# Patient Record
Sex: Female | Born: 1985 | Race: White | Hispanic: No | Marital: Married | Smoking: Current some day smoker
Health system: Southern US, Community
[De-identification: ages and names within clinical notes are randomized; demographics above are authoritative.]

## PROBLEM LIST (undated history)

## (undated) DIAGNOSIS — I10 Essential (primary) hypertension: Secondary | ICD-10-CM

## (undated) HISTORY — PX: MANDIBLE FRACTURE SURGERY: SHX706

## (undated) HISTORY — PX: FOREARM SURGERY: SHX651

## (undated) HISTORY — PX: ANKLE SURGERY: SHX546

## (undated) HISTORY — PX: TRACHEAL SURGERY: SHX1096

---
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
## 2011-01-22 ENCOUNTER — Encounter: Payer: Self-pay | Admitting: Orthopedic Surgery

## 2011-02-05 ENCOUNTER — Encounter: Payer: Self-pay | Admitting: Orthopedic Surgery

## 2011-02-05 ENCOUNTER — Ambulatory Visit (INDEPENDENT_AMBULATORY_CARE_PROVIDER_SITE_OTHER): Payer: BC Managed Care – PPO | Admitting: Orthopedic Surgery

## 2011-02-05 DIAGNOSIS — M79609 Pain in unspecified limb: Secondary | ICD-10-CM | POA: Insufficient documentation

## 2011-02-05 DIAGNOSIS — M659 Synovitis and tenosynovitis, unspecified: Secondary | ICD-10-CM

## 2011-02-11 ENCOUNTER — Ambulatory Visit: Payer: BC Managed Care – PPO | Attending: Orthopedic Surgery | Admitting: Physical Therapy

## 2011-02-11 DIAGNOSIS — IMO0001 Reserved for inherently not codable concepts without codable children: Secondary | ICD-10-CM | POA: Insufficient documentation

## 2011-02-11 DIAGNOSIS — M25579 Pain in unspecified ankle and joints of unspecified foot: Secondary | ICD-10-CM | POA: Insufficient documentation

## 2011-02-11 DIAGNOSIS — R5381 Other malaise: Secondary | ICD-10-CM | POA: Insufficient documentation

## 2011-02-11 NOTE — Assessment & Plan Note (Signed)
Summary: ankle pain bringing xr/MRI film & reports/bsbs   Visit Type:  NEW PATIENT  CC:  left ankle pain.  History of Present Illness: I saw Debbe Hard in the office today for an initial visit.  She is a 25 years old woman with the complaint of:  left ankle pain  c/o dull , 4/10, intermittent, ankle pain   Initially treated with nsaids, bracr ( air cast) ice and the swelling went down. But she still has pain medially and laterall ove rthe dorsum of the foot.   Medications: Prednisone dose pack 10 mg taking now, Zithromax 250 mg, multivitamins, Fishoil.  Xrays taken at [facility] on 12-11-10. ankle,  were negative   Preventive Screening-Counseling & Management  Alcohol-Tobacco     Alcohol drinks/day: 0     Smoking Status: never      Drug Use:  never.    Allergies (verified): No Known Drug Allergies  Past History:  Past Medical History: RIGHT HAND CHEST TUBE JAW SURGERY, PLATES BILATERAL TRACHEA SURGERY RIGHT ARM PLATE RIGHT ANKLE PLATE  Family History: FH of Cancer:  Family History of Diabetes Family History Coronary Heart Disease female < 15 Family History of Arthritis  Social History: Patient is married.  Ethnicity:  White Alcohol drinks/day:  0 Smoking Status:  never Drug Use:  never  Review of Systems Constitutional:  Denies weight loss, weight gain, fever, chills, and fatigue. Cardiovascular:  Denies chest pain, palpitations, fainting, and murmurs. Respiratory:  Complains of short of breath, wheezing, couch, tightness, and snoring; denies pain on inspiration and snoring . Gastrointestinal:  Complains of heartburn; denies nausea, vomiting, diarrhea, constipation, and blood in your stools. Genitourinary:  Complains of frequency and urgency; denies difficulty urinating, painful urination, flank pain, and bleeding in urine. Neurologic:  Complains of numbness and tingling; denies unsteady gait, dizziness, tremors, and  seizure. Musculoskeletal:  Complains of joint pain, swelling, stiffness, and muscle pain; denies instability, redness, and heat. Endocrine:  Denies excessive thirst, exessive urination, and heat or cold intolerance. Psychiatric:  Denies nervousness, depression, anxiety, and hallucinations. Skin:  Denies changes in the skin, poor healing, rash, itching, and redness. HEENT:  Denies blurred or double vision, eye pain, redness, and watering. Immunology:  Denies seasonal allergies, sinus problems, and allergic to bee stings. Hemoatologic:  Denies easy bleeding and brusing.  Physical Exam  Additional Exam:  GEN: well developed, well nourished, normal grooming and hygiene, no deformity and endomorphic   CDV: pulses are normal, no edema, no erythema. no tenderness  Lymph: normal lymph nodes   Skin: no rashes, skin lesions or open sores   NEURO: normal coordination, reflexes, sensation.   Psyche: awake, alert and oriented. 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 1 by mouth two times a day  Patient Instructions: 1)  PT 2)  POSTERIOR TIBIAL TENDON DISORDER  3)  CONTINUE NSAIDS , ICE AND START PT  4)  foot orthotics  5)  diclofenac resume  Prescriptions: DICLOFENAC SODIUM  75 MG TBEC (DICLOFENAC SODIUM) 1 by mouth two times a day  #60 x 2   Entered and Authorized by:   Fuller Canada MD   Signed by:   Fuller Canada MD on 02/05/2011   Method used:   Print then Give to Patient   RxID:   [id number]    Orders Added: 1)   New Patient Level III [86578]

## 2011-02-11 NOTE — Medication Information (Signed)
Summary: RX Folder orthotics  RX Folder orthotics   Imported By: Cammie Sickle 02/05/2011 19:25:57  _____________________________________________________________________  External Attachment:    Type:   Image     Comment:   External Document

## 2011-02-19 ENCOUNTER — Ambulatory Visit: Payer: BC Managed Care – PPO | Admitting: Physical Therapy

## 2011-02-20 NOTE — Miscellaneous (Signed)
Summary: Physical therapy order  Physical therapy order   Imported By: Cammie Sickle 02/14/2011 09:08:17  _____________________________________________________________________  External Attachment:    Type:   Image     Comment:   External Document

## 2011-02-20 NOTE — Letter (Signed)
Summary: History form  History form   Imported By: Jacklynn Ganong 02/11/2011 11:48:05  _____________________________________________________________________  External Attachment:    Type:   Image     Comment:   External Document

## 2011-02-21 ENCOUNTER — Ambulatory Visit: Payer: BC Managed Care – PPO | Attending: Orthopedic Surgery | Admitting: Physical Therapy

## 2011-02-21 DIAGNOSIS — R5381 Other malaise: Secondary | ICD-10-CM | POA: Insufficient documentation

## 2011-02-21 DIAGNOSIS — M25579 Pain in unspecified ankle and joints of unspecified foot: Secondary | ICD-10-CM | POA: Insufficient documentation

## 2011-02-21 DIAGNOSIS — IMO0001 Reserved for inherently not codable concepts without codable children: Secondary | ICD-10-CM | POA: Insufficient documentation

## 2011-02-24 ENCOUNTER — Ambulatory Visit: Payer: BC Managed Care – PPO | Attending: Orthopedic Surgery | Admitting: Physical Therapy

## 2011-02-24 DIAGNOSIS — R5381 Other malaise: Secondary | ICD-10-CM | POA: Insufficient documentation

## 2011-02-24 DIAGNOSIS — M25579 Pain in unspecified ankle and joints of unspecified foot: Secondary | ICD-10-CM | POA: Insufficient documentation

## 2011-02-24 DIAGNOSIS — IMO0001 Reserved for inherently not codable concepts without codable children: Secondary | ICD-10-CM | POA: Insufficient documentation

## 2011-02-25 ENCOUNTER — Ambulatory Visit: Payer: BC Managed Care – PPO | Admitting: Physical Therapy

## 2011-03-05 ENCOUNTER — Ambulatory Visit: Payer: BC Managed Care – PPO | Admitting: Physical Therapy

## 2011-03-07 ENCOUNTER — Ambulatory Visit: Payer: BC Managed Care – PPO | Admitting: Physical Therapy

## 2011-03-11 ENCOUNTER — Ambulatory Visit: Payer: BC Managed Care – PPO | Admitting: *Deleted

## 2011-03-14 ENCOUNTER — Ambulatory Visit: Payer: BC Managed Care – PPO | Admitting: Physical Therapy

## 2011-03-21 ENCOUNTER — Ambulatory Visit: Payer: BC Managed Care – PPO | Admitting: *Deleted

## 2011-04-16 ENCOUNTER — Ambulatory Visit (INDEPENDENT_AMBULATORY_CARE_PROVIDER_SITE_OTHER): Payer: BC Managed Care – PPO | Admitting: Orthopedic Surgery

## 2011-04-16 DIAGNOSIS — M659 Synovitis and tenosynovitis, unspecified: Secondary | ICD-10-CM

## 2011-04-16 NOTE — Patient Instructions (Signed)
Short medium cam walker

## 2011-04-16 NOTE — Progress Notes (Signed)
Followup for posterior tibial tendon disorder  Completed physical therapy.  SheAlready had one set of orthotics still has continued dorsal foot pain dorsal lateral foot pain and medial pain and soreness  Exam she is tender over the medial posterior tibial tendon dorsum of the foot nontender at the posterior lateral malleolus non-tender at the peroneal tendon insertion mildly tender in the tarsometatarsal joint laterally and dorsally.  Recommend Cam Walker for 6 weeks and reassess.

## 2011-05-27 ENCOUNTER — Encounter: Payer: Self-pay | Admitting: Orthopedic Surgery

## 2011-05-27 ENCOUNTER — Ambulatory Visit (INDEPENDENT_AMBULATORY_CARE_PROVIDER_SITE_OTHER): Payer: BC Managed Care – PPO | Admitting: Orthopedic Surgery

## 2011-05-27 DIAGNOSIS — M76829 Posterior tibial tendinitis, unspecified leg: Secondary | ICD-10-CM

## 2011-05-27 DIAGNOSIS — M79609 Pain in unspecified limb: Secondary | ICD-10-CM

## 2011-05-27 DIAGNOSIS — M659 Synovitis and tenosynovitis, unspecified: Secondary | ICD-10-CM

## 2011-05-27 DIAGNOSIS — M6789 Other specified disorders of synovium and tendon, multiple sites: Secondary | ICD-10-CM

## 2011-05-27 MED ORDER — ACETAMINOPHEN-CODEINE 300-30 MG PO TABS: 1.0000 | ORAL_TABLET | Freq: Four times a day (QID) | ORAL | Status: AC | PRN

## 2011-05-27 NOTE — Progress Notes (Signed)
   25 year old female presented to me with medial ankle pain was treated with anti-inflammatories, physical therapy and I Cam Walker presents back with no improvement.  Complains of medial ankle pain and now she is walking on the outer border of the foot.  Her initial history is as follows: Xrays taken at [facility] on 12-11-10. ankle, were negative  Preventive Screening-Counseling & Management  Alcohol-Tobacco  Alcohol drinks/day: 0  Smoking Status: never  Drug Use: never.  Allergies (verified):  No Known Drug Allergies  Past History:  Past Medical History:  RIGHT HAND  CHEST TUBE  JAW SURGERY, PLATES BILATERAL  TRACHEA SURGERY  RIGHT ARM PLATE  RIGHT ANKLE PLATE  Family History:  FH of Cancer:  Family History of Diabetes  Family History Coronary Heart Disease female < 30  Family History of Arthritis  Social History:  Patient is married.  Ethnicity: White  Alcohol drinks/day: 0  Smoking Status: never  Drug Use: never  Review of Systems  Constitutional: Denies weight loss, weight gain, fever, chills, and fatigue.  Cardiovascular: Denies chest pain, palpitations, fainting, and murmurs.  Respiratory: Complains of short of breath, wheezing, couch, tightness, and snoring; denies pain on inspiration and snoring .  Gastrointestinal: Complains of heartburn; denies nausea, vomiting, diarrhea, constipation, and blood in your stools.  Genitourinary: Complains of frequency and urgency; denies difficulty urinating, painful urination, flank pain, and bleeding in urine.  Neurologic: Complains of numbness and tingling; denies unsteady gait, dizziness, tremors, and seizure.  Musculoskeletal: Complains of joint pain, swelling, stiffness, and muscle pain; denies instability, redness, and heat.  Endocrine: Denies excessive thirst, exessive urination, and heat or cold intolerance.  Psychiatric: Denies nervousness, depression, anxiety, and hallucinations.  Skin: Denies changes in the  skin, poor healing, rash, itching, and redness.  HEENT: Denies blurred or double vision, eye pain, redness, and watering.  Immunology: Denies seasonal allergies, sinus problems, and allergic to bee stings.  Hemoatologic: Denies easy bleeding and brusing.  Physical examination reveals tenderness of the medial posterior tibial tendon as well as peroneal spasm and weakness of the posterior tibial tendon  X-rays were  Recommend the foot and ankle specialist for evaluation and treatment as the nonoperative measures have failed to yield a good result

## 2011-05-27 NOTE — Patient Instructions (Signed)
If boot makes your foot hurt worse take it off  Referral to Dr. Lestine Box

## 2011-05-29 ENCOUNTER — Telehealth: Payer: Self-pay | Admitting: Radiology

## 2011-05-29 NOTE — Telephone Encounter (Signed)
I faxed a referral for this patient to Dr. Lestine Box to be seen for PTTD of left ankle.

## 2011-06-03 ENCOUNTER — Ambulatory Visit: Payer: BC Managed Care – PPO | Admitting: Orthopedic Surgery

## 2011-08-08 ENCOUNTER — Ambulatory Visit (HOSPITAL_COMMUNITY)
Admission: RE | Admit: 2011-08-08 | Discharge: 2011-08-08 | Disposition: A | Discharge status: Home / Self Care | Payer: BC Managed Care – PPO | Source: Ambulatory Visit | Attending: Orthopedic Surgery | Admitting: Orthopedic Surgery

## 2011-08-08 DIAGNOSIS — M25579 Pain in unspecified ankle and joints of unspecified foot: Secondary | ICD-10-CM | POA: Insufficient documentation

## 2011-08-08 DIAGNOSIS — M6281 Muscle weakness (generalized): Secondary | ICD-10-CM | POA: Insufficient documentation

## 2011-08-08 DIAGNOSIS — M25676 Stiffness of unspecified foot, not elsewhere classified: Secondary | ICD-10-CM | POA: Insufficient documentation

## 2011-08-08 DIAGNOSIS — IMO0001 Reserved for inherently not codable concepts without codable children: Secondary | ICD-10-CM | POA: Insufficient documentation

## 2011-08-08 DIAGNOSIS — M25673 Stiffness of unspecified ankle, not elsewhere classified: Secondary | ICD-10-CM | POA: Insufficient documentation

## 2011-08-08 DIAGNOSIS — R262 Difficulty in walking, not elsewhere classified: Secondary | ICD-10-CM | POA: Insufficient documentation

## 2011-08-08 DIAGNOSIS — M76829 Posterior tibial tendinitis, unspecified leg: Secondary | ICD-10-CM | POA: Insufficient documentation

## 2011-08-08 NOTE — Progress Notes (Signed)
Physical Therapy Evaluation  Patient Details  Name: Brenda Yang MRN: [id number] Date of Birth: 1986-01-28  Today's Date: 08/08/2011 Time: 4098-1191 Time Calculation (min): 29 min Charges: 1 eval Visit#: 1 of 6 Re-eval: 09/07/11    Past Medical History: No past medical history on file. Past Surgical History: No past surgical history on file.  Subjective Symptoms/Limitations Symptoms: Pt reports she has had pain in her L ankle for about 1 year.  She has tried OTC orthotics, short CAM boot, 6 weeks of PT in [place] several months ago.  Pt met w/Dr. Lestine Yang yesterday (08/07/11) and placed her in a mid calf CAM boot.  Previously therapy included [place], Stretching, TENS/Ice, band strengthening.  She was completeing her HEP which she reports helped with the pain and not the stiffness. Next MD visit in 3 weeks.  C/co is pain and MD gave her pain medication and she has not taken any today.  PMH: Has had tendonitis in L foot after walking in [place] for 3 days.  She works 12 hour shifts Limitations: Walking;Standing How long can you stand comfortably?: 20 minutes-4 hours with shoes. How long can you walk comfortably?: 20 minutes - 4 hours with shoes Pain Assessment Currently in Pain?: Yes Pain Score:   7 Pain Location: Ankle Pain Orientation: Lateral Pain Type: Chronic pain Pain Onset: More than a month ago  Precautions/Restrictions     Prior Functioning  Prior Function Driving: Yes Able to Take Stairs Reciprically: Yes Vocation: Full time employment Leisure: Hobbies-yes (Comment) Comments: Enjoys the outdoors (plays badmitten, hiking with husband, drives a stick shift has difficulty driving it)  Cognition    Sensation/Coordination/Flexibility    Assessment LLE AROM (degrees) Left Ankle Dorsiflexion 0-20: 5  Left Ankle Plantar Flexion 0-45: 45  Left Ankle Inversion: 25  Left Ankle Eversion: 5  LLE PROM (degrees) Left Ankle Dorsiflexion 0-20: 10  Left Ankle Plantar Flexion  0-45: 60  Left Ankle Eversion : 25 LLE Strength Left Hip Flexion: 4/5 Left Hip ABduction: 3+/5 Left Hip ADduction: 3+/5 Left Knee Flexion: 5/5 Left Knee Extension: 5/5 Left Ankle Dorsiflexion: 5/5 Left Ankle Plantar Flexion: 2+/5 Left Ankle Inversion: 5/5 Left Ankle Eversion: 5/5  Mobility (including Balance) Ambulation/Gait Ambulation/Gait: Yes Gait Pattern: Decreased stance time - left;Step-through pattern;Decreased dorsiflexion - left;Decreased weight shift to left  Static Standing Balance Single Leg Stance - Left Leg: 10  (impaired ankle strategy) Tandem Stance - Left Leg: 5  (impaired ankle and hip strategy)Ambulates on outside of L foot during stance phase Impaired ankle strategy with L single leg stance Palpation: Decreased L calcaneal mobility w/increased pain with calcaneal eversion.  Pain and tenderness with palpation to posterior tibialis musculotendinous junction.  Exercise/Treatments Stretches LLE:  Gastroc x30 sec  Soleus x30 sec  Plantar x30 sec Educated on T-band exercises, she has been given in the past with previous therapy.  Supplied Blue T-band w/pictures for [employer].   Physical Therapy Assessment and Plan PT Assessment and Plan Clinical Impression Statement: Pt is a 25 y.o.femal referred to PT secondary to L ankle posterior tibial tendonitis.  After examination it was found that the patient has current body structure impairments of increased pain, decreased strength, impaired balance, decreased calcaneal mobility which are limiting her ability to participate in work and lesiure activities.  Pt will benefit from skilled outpatient physical therapy services in order to address the above impairments in order to maximize independence. Rehab Potential: Good PT Frequency: Min 2X/week PT Duration:  (3 weeks) PT Treatment/Interventions: Gait training;Stair  training;Functional mobility training;Therapeutic exercise;Other (comment);Balance training (Manual and modalities  for pain control) PT Plan: Add: TM walking/Elliptical, Towel scrunches, Inversion/Eversion, Marble pick up, and t-band, manual technique for calcenal mobility check for order of iontopheresis that was faxed on 08/08/11 to Dr. Lestine Yang    Goals PT Short Term Goals PT Short Term Goal 1: 1.Pt will be Independent in HEP in order to maximize therapeutic effect. PT Short Term Goal 2: 2.Pt will report pain less than or 2/10 for 50% of her day. PT Short Term Goal 3: 3.Pt will improve ankle ROM to [facility]. PT Short Term Goal 4: 4.Pt will present with improved calcaneal mobility without an increase in pain.  PT Long Term Goals PT Long Term Goal 1: 1.Pt will report pain less than or equal to 2/10 for while independently walking for 60 minutes in order to participate in community and leisure activities. PT Long Term Goal 2: 2.Pt will demonstrate tandem walking on uneven surface x100 ft in order to participate safely in outdoor activities..  Problem List Patient Active Problem List  Diagnoses  . TENOSYNOVITIS OF FOOT AND ANKLE  . FOOT PAIN, LEFT  . Posterior tibial tendinitis  . Pain in joint, ankle and foot    PT - End of Session Activity Tolerance: Patient tolerated treatment well   Brenda Yang 08/08/2011, 7:06 PM  Physician Documentation Your signature is required to indicate approval of the treatment plan as stated above.  Please sign and either send electronically or make a copy of this report for your files and return this physician signed original.   Please mark one 1.__approve of plan  2. ___approve of plan with the following conditions.   ______________________________                                                          _____________________ Physician Signature                                                                                                             Date

## 2011-08-15 ENCOUNTER — Ambulatory Visit (HOSPITAL_COMMUNITY)
Admission: RE | Admit: 2011-08-15 | Discharge: 2011-08-15 | Disposition: A | Discharge status: Home / Self Care | Payer: BC Managed Care – PPO | Source: Ambulatory Visit | Attending: Physical Therapy | Admitting: Physical Therapy

## 2011-08-15 NOTE — Progress Notes (Signed)
Physical Therapy Treatment Patient Details  Name: Brenda Yang MRN: [id number] Date of Birth: [birth year]  Today's Date: 08/15/2011 Time: 4098-1191 Time Calculation (min): 45 min Charges: 8' [place], 15' man, 20' TE Visit#: 2  of 8   Re-eval: 09/07/11 Assessment Next MD Visit: 08/25/11  Subjective: Symptoms/Limitations Symptoms: Pt states that she is not really hurting right now she is doing her stretches every night. My pain is worse on the outside of my foot, even though I know I have swelling on the inside of my ankle.  I think it is because I am walking on the outside of my foot so much.  Pain Assessment Currently in Pain?: No/denies   Exercise/Treatments Stretches Gastroc Stretch: 3 reps;30 seconds Soleus Stretch: 3 reps;30 seconds Aerobic Elliptical: L1x 5' Seated Other Seated Knee Exercises: Plantar Fascia St 3x30 sec  Ankle Exercises Ankle Dorsiflexion: Left;10 reps;Seated;Theraband Theraband Level (Ankle Dorsiflexion): Level 4 (Blue) Ankle Plantar Flexion: Left;10 reps;Seated;Theraband Theraband Level (Ankle Plantar Flexion): Level 4 (Blue) Ankle Eversion: Left;10 reps;Seated;Theraband Theraband Level (Ankle Eversion): Level 4 (Blue) Ankle Inversion: Left;10 reps;Seated;Theraband Theraband Level (Ankle Inversion): Level 4 (Blue)  Towel Crunch: 1 rep Towel Inversion/Eversion: Limitations Towel Inversion/Eversion Limitations: 2x10 Marble Pick Up 3x5  Modalities: Modalities Ultrasound: Ultrasound Location L peroneal insertion Ultrasound Parameters 3 Mhz, 0.8 w/cm2 x8 minutes Ultrasound Goals Pain Manual Therapy: Manual Therapy Joint mobilization Joint Mobilization Grade II-IV calceneal, talar, and metatarsal joint mobs to increased IN, EV, DF and metatarsal mobility Other Manual Therapy STM 5th metatarsal to peroneal area.    Physical Therapy Assessment and Plan PT Assessment and Plan Clinical Impression Statement: Pt tolerated all new ther-ex well.  She had  decreased pain to peroneal insertion after manual techniques and [place] today.   PT Plan: Add heel and toe raises    Goals    Problem List Patient Active Problem List  Diagnoses  . TENOSYNOVITIS OF FOOT AND ANKLE  . FOOT PAIN, LEFT  . Posterior tibial tendinitis  . Pain in joint, ankle and foot    PT - End of Session Activity Tolerance: Patient tolerated treatment well  Fredick Schlosser 08/15/2011, 10:30 AM

## 2011-08-20 ENCOUNTER — Ambulatory Visit (HOSPITAL_COMMUNITY)
Admission: RE | Admit: 2011-08-20 | Discharge: 2011-08-20 | Disposition: A | Discharge status: Home / Self Care | Payer: BC Managed Care – PPO | Source: Ambulatory Visit

## 2011-08-20 NOTE — Progress Notes (Signed)
Physical Therapy Treatment Patient Details  Name: Brenda Yang MRN: [id number] Date of Birth: 09-Jan-1986  Today's Date: 08/20/2011 Time: 1521-1600 Time Calculation (min): 39 min Visit#: 3  of 8   Re-eval: 09/07/11  Charge: therex 26 min [place] 8 min  Subjective: Symptoms/Limitations Symptoms: Pt. states PT is helping, intermittent pain.  My pain was worse following work yesterday and this morning.  Maybe a 1/10 pain scale. Pain Assessment Currently in Pain?: Yes Pain Score:   1 Pain Location: Ankle Pain Orientation: Left  Objective:   Exercise/Treatments Aerobic  Elliptical: L1x 5'  Standing Gastroc Stretch: 3 reps;30 seconds  Soleus Stretch: 3 reps;30 seconds  Knee Exercises: Plantar Fascia St 3x30 sec  Heel raises 10 reps Toe raises 10 reps Ankle Exercises  Towel Crunch: 1 rep  Towel Inversion/Eversion: Limitations  Towel Inversion/Eversion Limitations: 2x10  Marble Pick Up 3x7 marbles   Physical Therapy Assessment and Plan PT Assessment and Plan Clinical Impression Statement: Pt demonstrated all new therex well with difficulty and no c/o of increased pain.  She stated decreased pain following manual technique and [place] today. PT Plan: Begin BAPS L2 within tolerance next session for ankle ROM.    Goals    Problem List Patient Active Problem List  Diagnoses  . TENOSYNOVITIS OF FOOT AND ANKLE  . FOOT PAIN, LEFT  . Posterior tibial tendinitis  . Pain in joint, ankle and foot    General Behavior During Session: [facility] for tasks performed Cognition: [facility] for tasks performed  [name] 08/20/2011, 4:43 PM

## 2011-08-21 ENCOUNTER — Ambulatory Visit (HOSPITAL_COMMUNITY)
Admission: RE | Admit: 2011-08-21 | Discharge: 2011-08-21 | Disposition: A | Discharge status: Home / Self Care | Payer: BC Managed Care – PPO | Source: Ambulatory Visit | Attending: Family Medicine | Admitting: Family Medicine

## 2011-08-21 NOTE — Progress Notes (Signed)
Physical Therapy Progress Note  Patient Details  Name: Brenda Yang MRN: [id number] Date of Birth: August 07, 1986  Today's Date: 08/21/2011 Time: 5284-1324 Time Calculation (min): 45 min Charges: 1 ROM, 30' TE, 8' [place] Visit#: 4  of 8   Re-eval: 09/04/11    Past Medical History: No past medical history on file. Past Surgical History: No past surgical history on file.  Subjective Symptoms/Limitations Symptoms: Pt reports that she feels that everythin is really helping to decrease her overall pain in her foot.  She reports her average pain is a 5-6/10.  On days I don't work it doesn't hurt to bad.  She reports she is about 50% How long can you stand comfortably?: 59min-4 hours with decrease in intensity in pain.  Pain Assessment Currently in Pain?: Yes Pain Score:   1 Pain Location: Ankle Pain Orientation: Left  Assessment LLE AROM (degrees) Left Ankle Dorsiflexion 0-20: 7  Left Ankle Plantar Flexion 0-45: 45  Left Ankle Inversion: 35  Left Ankle Eversion: 12  LLE PROM (degrees) Left Ankle Dorsiflexion 0-20: 12  Left Ankle Plantar Flexion 0-45: 60 (Eversion 35) LLE Strength Left Ankle Plantar Flexion: 5/5  Exercise/Treatments  Elliptical x6' 2.0 Seated  Marble Pick Up 5x7 marbles Gastroc and Soleus Stretch 3x30 sec each Unilateral heel raise 10x Tandem gait on uneven surface x100'  Ankle Exercises   Towel Crunch: 5 reps Towel Inversion/Eversion: 5 reps BAPS: Sitting;Level 2;10 reps;Limitations BAPS Limitations: A/P, IN/EV, CW, CCW x10 each  Modalities Modalities: Ultrasound Ultrasound Ultrasound Location: L peroneal insertion  Ultrasound Parameters: 3 Mhz, 0.8 w/cm2 pulsed x8 minutes  #3  Physical Therapy Assessment and Plan PT Assessment and Plan Clinical Impression Statement: Brenda Yang was referred to PT secondary to L posterior tibial tendonitis.  Over the past 3 weeks she has made significant gains in decreased intensity of pain to her posterior tibial  tendon region, improved ROM and strength.  She continues to have impairments including pain to her peroneal brevis insertion secondary to improper gait mechanics, balance and walking independently without an increase in pain, and will continue to need greater improvement with ankle flexibility and ROM.  She will continue to benefit from skilled PT in order to address the above impairments in order to maximize function.  Rehab Potential: Good PT Frequency: Min 2X/week PT Duration: Other (comment) (2 weeks) PT Treatment/Interventions: Gait training;Therapeutic exercise;Balance training;Other (comment) (Manual and modalities for pain control.) PT Plan: Add single leg stance, heel walking, toe walking    Goals PT Short Term Goals PT Short Term Goal 1: 1.Pt will be Independent in HEP in order to maximize therapeutic effect. PT Short Term Goal 1 - Progress: Met PT Short Term Goal 2: 2.Pt will report pain less than or 2/10 for 50% of her day. PT Short Term Goal 2 - Progress: Not met PT Short Term Goal 3: 3.Pt will improve ankle ROM to [facility]. PT Short Term Goal 4: 4.Pt will present with improved calcaneal mobility without an increase in pain.  PT Short Term Goal 4 - Progress: Met PT Long Term Goals PT Long Term Goal 1: 1.Pt will report pain less than or equal to 2/10 for while independently walking for 60 minutes in order to participate in community and leisure activities. PT Long Term Goal 1 - Progress: Not met PT Long Term Goal 2: 2.Pt will demonstrate tandem walking on uneven surface x100 ft in order to participate safely in outdoor activities..  Problem List Patient Active Problem List  Diagnoses  .  TENOSYNOVITIS OF FOOT AND ANKLE  . FOOT PAIN, LEFT  . Posterior tibial tendinitis  . Pain in joint, ankle and foot    PT - End of Session Activity Tolerance: Patient tolerated treatment well   Brenda Yang 08/21/2011, 4:34 PM  Physician Documentation Your signature is required to indicate  approval of the treatment plan as stated above.  Please sign and either send electronically or make a copy of this report for your files and return this physician signed original.   Please mark one 1.__approve of plan  2. ___approve of plan with the following conditions.   ______________________________                                                          _____________________ Physician Signature                                                                                                             Date

## 2011-08-22 ENCOUNTER — Ambulatory Visit (HOSPITAL_COMMUNITY): Payer: BC Managed Care – PPO

## 2011-08-25 ENCOUNTER — Ambulatory Visit (HOSPITAL_COMMUNITY)
Admission: RE | Admit: 2011-08-25 | Discharge: 2011-08-25 | Disposition: A | Discharge status: Home / Self Care | Payer: BC Managed Care – PPO | Source: Ambulatory Visit | Attending: Orthopedic Surgery | Admitting: Orthopedic Surgery

## 2011-08-25 DIAGNOSIS — M25676 Stiffness of unspecified foot, not elsewhere classified: Secondary | ICD-10-CM | POA: Insufficient documentation

## 2011-08-25 DIAGNOSIS — R262 Difficulty in walking, not elsewhere classified: Secondary | ICD-10-CM | POA: Insufficient documentation

## 2011-08-25 DIAGNOSIS — M6281 Muscle weakness (generalized): Secondary | ICD-10-CM | POA: Insufficient documentation

## 2011-08-25 DIAGNOSIS — M25579 Pain in unspecified ankle and joints of unspecified foot: Secondary | ICD-10-CM | POA: Insufficient documentation

## 2011-08-25 DIAGNOSIS — M25673 Stiffness of unspecified ankle, not elsewhere classified: Secondary | ICD-10-CM | POA: Insufficient documentation

## 2011-08-25 DIAGNOSIS — IMO0001 Reserved for inherently not codable concepts without codable children: Secondary | ICD-10-CM | POA: Insufficient documentation

## 2011-08-25 NOTE — Progress Notes (Signed)
Physical Therapy Treatment Patient Details  Name: Brenda Yang MRN: [id number] Date of Birth: [birth year]  Today's Date: 08/25/2011 Time: 4098-1191 Time Calculation (min): 40 min Visit#: 5  of 8   Re-eval: 09/04/11 Charges:  therex 25' , ultrasound 8'    Subjective: Symptoms/Limitations Symptoms: Pt. states she went to [facility] over the weekend and has increased pain today from the walking; 2-3/10 Pain Assessment Currently in Pain?: Yes Pain Score:   2 Pain Location: Ankle Pain Orientation: Left   Exercise/Treatments Elliptical 6' @ L2 Standing: Gastroc stretch with slant board 3X30" Heelwalk 1RT [place] 1RT L SLS max of 8 seconds Seated: BAPS L2 10X each direction Towel inversion/eversion 5X each with 3#   Modalities Modalities: Ultrasound Ultrasound Ultrasound Location: L peroneal insertion Ultrasound Parameters: , 0.8 w/cm2 pulsed 8 minutes #4  Physical Therapy Assessment and Plan PT Assessment and Plan Clinical Impression Statement: Pt. able to complete new exercises without difficulty or C/O pain.  Added 3# weight to towel inv/ev without difficulty. PT Treatment/Interventions: Therapeutic exercise (ultrasound) PT Plan: Progress BAPS to standing, Continue to increase stab and decrease pain.     Problem List Patient Active Problem List  Diagnoses  . TENOSYNOVITIS OF FOOT AND ANKLE  . FOOT PAIN, LEFT  . Posterior tibial tendinitis  . Pain in joint, ankle and foot    PT - End of Session Activity Tolerance: Patient tolerated treatment well General Behavior During Session: [facility] for tasks performed Cognition: [facility] for tasks performed  Bascom Levels, Laetitia Schnepf B 08/25/2011, 1:50 PM

## 2011-08-26 ENCOUNTER — Ambulatory Visit (HOSPITAL_COMMUNITY): Payer: BC Managed Care – PPO | Admitting: Physical Therapy

## 2011-08-27 ENCOUNTER — Ambulatory Visit (HOSPITAL_COMMUNITY)
Admission: RE | Admit: 2011-08-27 | Discharge: 2011-08-27 | Disposition: A | Discharge status: Home / Self Care | Payer: BC Managed Care – PPO | Source: Ambulatory Visit | Attending: Family Medicine | Admitting: Family Medicine

## 2011-08-27 NOTE — Progress Notes (Signed)
Physical Therapy Treatment Patient Details  Name: Brenda Yang MRN: [id number] Date of Birth: [birth year]  Today's Date: 08/27/2011 Time: 1191-4782 Time Calculation (min): 38 min Charges: 30' TE, 8' [place] Visit#: 6  of 8   Re-eval: 09/10/11    Subjective: Symptoms/Limitations Symptoms: Pt reports she had a visit with the MD who wants her to continue to wear her CAM boot until she can transition into her custom orthotics.  She reports she does not have any lingering pain from her trip.  Pain Assessment Currently in Pain?: No/denies  Exercise/Treatments  08/27/11 0700  Knee Exercises: Stretches  Gastroc Stretch 3 reps;30 seconds;Limitations  Gastroc Stretch Limitations on slant board  Knee Exercises: Aerobic  Elliptical L2 x6'  Knee Exercises: Standing  Lateral Step Up 10 reps;Step Height: 4"  Forward Step Up 10 reps;Step Height: 4"  Step Down 10 reps;Step Height: 4"     Ankle Exercises   Towel Crunch: Limitations Towel Crunch Limitations: 1 # Towel Inversion/Eversion: 5 reps Towel Inversion/Eversion Limitations: 3# Rocker Board: 1 minute;Limitations Rocker Board Limitations: A/P and S to S Heel Walk (Round Trip): 2 RT Toe Walk (Round Trip): 2 RT  Modalities Modalities: Ultrasound Ultrasound Ultrasound Location: L peroneal insertion Ultrasound Parameters: , 0.8 w/cm2 pulsed 8 minutes   Physical Therapy Assessment and Plan PT Assessment and Plan Clinical Impression Statement: Pt has an overall improvement in strength and ROM.  Continues to have an overall decrease in pain.  PT Frequency: Min 2X/week PT Duration:  (2 weeks)    Goals    Problem List Patient Active Problem List  Diagnoses  . TENOSYNOVITIS OF FOOT AND ANKLE  . FOOT PAIN, LEFT  . Posterior tibial tendinitis  . Pain in joint, ankle and foot    PT - End of Session Activity Tolerance: Patient tolerated treatment well  Donyale Berthold 08/27/2011, 4:07 PM

## 2011-09-01 ENCOUNTER — Ambulatory Visit (HOSPITAL_COMMUNITY)
Admission: RE | Admit: 2011-09-01 | Discharge: 2011-09-01 | Disposition: A | Discharge status: Home / Self Care | Payer: BC Managed Care – PPO | Source: Ambulatory Visit | Attending: Family Medicine | Admitting: Family Medicine

## 2011-09-01 NOTE — Progress Notes (Signed)
Physical Therapy Treatment Patient Details  Name: Brenda Yang MRN: [id number] Date of Birth: 01/25/1986  Today's Date: 09/01/2011 Time: 4098-1191 Time Calculation (min): 43 min Visit#: 7  of 8   Re-eval: 09/10/11  Charge: therex 29 min [place] 8 min  Subjective: Symptoms/Limitations Symptoms: Pain free at entrance  Objecitve Exercise/Treatments Elliptical 6' L3 Standing: Forward step up 10 reps 6" step Lateral step up 10 reps 4" step Step down 10 reps 4" step Gastroc st 3x 30" Soleus st 3x 30" BAPS standing A/P; R/L  5 reps each Rocker Board 1 minute Limitations: A/P and R/L  Heel Walk (Round Trip): 2 RT  Toe Walk (Round Trip): 2 RT  Towel Crunch: Limitations  Towel Crunch Limitations: 2 #  Towel Inversion/Eversion: 5 reps  Towel Inversion/Eversion Limitations: 3#   Modalities Modalities: Ultrasound Ultrasound Ultrasound Location: L peroneal insertion Ultrasound Parameters: , 0.8 w/cm2 pulsed 8 minutes  Physical Therapy Assessment and Plan PT Assessment and Plan Clinical Impression Statement: Overall improving strength and ROM.  Progressed BAPS board to standing with instability noted with most difficulty inversion/eversion movements. PT Plan: Reeval next session.    Goals    Problem List Patient Active Problem List  Diagnoses  . TENOSYNOVITIS OF FOOT AND ANKLE  . FOOT PAIN, LEFT  . Posterior tibial tendinitis  . Pain in joint, ankle and foot    PT - End of Session Activity Tolerance: Patient tolerated treatment well General Behavior During Session: [facility] for tasks performed Cognition: [facility] for tasks performed  [name] 09/01/2011, 4:11 PM

## 2011-09-03 ENCOUNTER — Ambulatory Visit (HOSPITAL_COMMUNITY): Payer: BC Managed Care – PPO | Admitting: Physical Therapy

## 2011-09-09 ENCOUNTER — Ambulatory Visit (HOSPITAL_COMMUNITY)
Admission: RE | Admit: 2011-09-09 | Discharge: 2011-09-09 | Disposition: A | Discharge status: Home / Self Care | Payer: BC Managed Care – PPO | Source: Ambulatory Visit

## 2011-09-09 NOTE — Progress Notes (Signed)
Physical Therapy Treatment Patient Details  Name: Brenda Yang MRN: [id number] Date of Birth: 02-Mar-1986  Today's Date: 09/09/2011 Time: 4098-1191 Time Calculation (min): 61 min Visit#: 8  of 8   Re-eval: 09/23/11  Charge: Therex 35 min [place] 8 min  Subjective: Symptoms/Limitations Symptoms: Little pain, 1-2/10, scheduled to get orthotics on this Friday. Pain Assessment Currently in Pain?: Yes Pain Score:   2 Pain Location: Ankle Pain Orientation: Left  Objective:   Exercise/Treatments Rocker Board: 2 minutes;Limitations Rocker Board Limitations: A/P; R/L x each SLS: max of 8 sec, 2x 30" with 1 finger assistance  Towel Crunch: 5 reps Towel Crunch Limitations: 3# Towel Inversion/Eversion: 5 reps Towel Inversion/Eversion Limitations: 4# BAPS: Standing;Level 2;10 reps BAPS Limitations: A/P, In/Ev, CW/ CCW 10 each SLS: max of 8 sec, 2x 30" with 1 finger assistance Rocker Board: 2 minutes;Limitations Rocker Board Limitations: A/P and R/L Heel Walk (Round Trip): 2 RT Toe Walk (Round Trip): 2 RT   Modalities Modalities: Ultrasound Ultrasound Ultrasound Location: L peroneal insertion Ultrasound Parameters: 3 MHz 0.8 w/cm2 pulsed x 8 minutes  Physical Therapy Assessment and Plan PT Assessment and Plan Clinical Impression Statement: Instabile ankle strategy shown with BAPS board standing.  Changed forward step up to a power up for endurance and overall ankle power/strength.  Pt assessed goals, after discussion with Annett Fabian, PT decision was made to continue PT for 2 more weeks once pt. is removed from boot and transitioned to her ortothic shoe this Friday.  PT Plan: Continue PT for 2 more weeks to address remaining deficitis.    Goals PT Short Term Goals PT Short Term Goal 1 - Progress: Met PT Short Term Goal 2 - Progress: Met PT Short Term Goal 4 - Progress: Met PT Long Term Goals PT Long Term Goal 1 - Progress: Progressing toward goal PT Long Term Goal 2 -  Progress: Progressing toward goal  Problem List Patient Active Problem List  Diagnoses  . TENOSYNOVITIS OF FOOT AND ANKLE  . FOOT PAIN, LEFT  . Posterior tibial tendinitis  . Pain in joint, ankle and foot    PT - End of Session Activity Tolerance: Patient tolerated treatment well General Behavior During Session: [facility] for tasks performed Cognition: [facility] for tasks performed  [name] 09/09/2011, 4:58 PM

## 2011-09-11 ENCOUNTER — Ambulatory Visit (HOSPITAL_COMMUNITY)
Admission: RE | Admit: 2011-09-11 | Discharge: 2011-09-11 | Disposition: A | Discharge status: Home / Self Care | Payer: BC Managed Care – PPO | Source: Ambulatory Visit | Attending: Family Medicine | Admitting: Family Medicine

## 2011-09-11 NOTE — Progress Notes (Signed)
Physical Therapy Treatment Patient Details  Name: Brenda Yang MRN: [id number] Date of Birth: [birth year]  Today's Date: 09/11/2011 Time: 4098-1191 Time Calculation (min): 47 min Charges: 39' TE, 8' NMR Visit#: 9  of 12   Re-eval: 09/23/11    Subjective: Symptoms/Limitations Symptoms: Pt reports that she is doing well, especially since she does not have to walk as much as she used to.  She reports she is getting her orthotic tomorrow.  Educated pt on proper proper orthotic use.  Pain Assessment Pain Score:   1 Pain Location: Ankle  Exercise/Treatments Ankle Exercises   Towel Crunch: Limitations Towel Crunch Limitations: 8 x  Towel Inversion/Eversion: Limitations Towel Inversion/Eversion Limitations: 8x, 4# BAPS: Standing;Level 2;10 reps BAPS Limitations: A/P, In/Ev, CW/ CCW 12x each SLS: 3x30 sec w/HHA on foam - cueing for proper foot alignment Rocker Board: 2 minutes;Limitations Rocker Board Limitations: A/P and R/L Power Ups on 4in. Stair x10  Gastroc and Soleus Stretch 3x30 sec  Physical Therapy Assessment and Plan PT Assessment and Plan Clinical Impression Statement: Pt continues to improve her overall strength and balance, however has difficulty with high level balance activities.  PT Plan: Cont to progress.     Goals    Problem List Patient Active Problem List  Diagnoses  . TENOSYNOVITIS OF FOOT AND ANKLE  . FOOT PAIN, LEFT  . Posterior tibial tendinitis  . Pain in joint, ankle and foot    PT - End of Session Activity Tolerance: Patient tolerated treatment well  Lehi Phifer 09/11/2011, 4:04 PM

## 2011-09-16 ENCOUNTER — Ambulatory Visit (HOSPITAL_COMMUNITY)
Admission: RE | Admit: 2011-09-16 | Discharge: 2011-09-16 | Disposition: A | Discharge status: Home / Self Care | Payer: BC Managed Care – PPO | Source: Ambulatory Visit | Attending: Family Medicine | Admitting: Family Medicine

## 2011-09-16 NOTE — Progress Notes (Signed)
Physical Therapy Treatment Patient Details  Name: Brenda Yang MRN: [id number] Date of Birth: 1986-01-14  Today's Date: 09/16/2011 Time: 0865-7846 Time Calculation (min): 48 min Charges: TE: 40', [place]: 8' Visit#: 10  of 12   Re-eval: 09/23/11    Subjective: Symptoms/Limitations Symptoms: Pt reports she recieved her orthotics on Friday and has been wearing them for about 4-5 hours.  She will bring them to therapy next week for her last week of therapy.  Pain Assessment Currently in Pain?: Yes Pain Score:   2 Pain Location: Ankle   Exercise/Treatments Aerobic Elliptical: 8' L4 Standing Wall Squat: 10 reps;10 seconds Lunge Walking - Round Trips: 2 RT Rocker Board: 2 minutes Rocker Board Limitations: A/P; R/L x each Other Standing Knee Exercises: heelwalk/ toewalk 2RT  Towel Crunch: 5 reps Towel Crunch Limitations: 4# Towel Inversion/Eversion: Limitations Towel Inversion/Eversion Limitations: 10x 8# BAPS: Standing;Level 3;10 reps BAPS Limitations: A/P, In/Ev, CW/ CCW 12x each Rocker Board: 2 minutes;Limitations Rocker Board Limitations: A/P and R/L Heel Walk (Round Trip): 2 RT Toe Walk (Round Trip): 2 RT  Modalities Modalities: Ultrasound Ultrasound Ultrasound Location: L peroneal insertion  Ultrasound Parameters: 8 MHz 0.8w/cm2 pulsed x8 minutes  Physical Therapy Assessment and Plan PT Assessment and Plan Clinical Impression Statement: Pt continues to show improved ankle strength, balance and ROM. PT Plan: Cont to progress, re-eval in 2 visits. Complete therapy with orthotics in.    Goals    Problem List Patient Active Problem List  Diagnoses  . TENOSYNOVITIS OF FOOT AND ANKLE  . FOOT PAIN, LEFT  . Posterior tibial tendinitis  . Pain in joint, ankle and foot       Suzie Vandam 09/16/2011, 3:19 PM

## 2011-09-17 ENCOUNTER — Ambulatory Visit (HOSPITAL_COMMUNITY): Payer: BC Managed Care – PPO | Admitting: Physical Therapy

## 2011-09-23 ENCOUNTER — Ambulatory Visit (HOSPITAL_COMMUNITY)
Admission: RE | Admit: 2011-09-23 | Discharge: 2011-09-23 | Disposition: A | Discharge status: Home / Self Care | Payer: BC Managed Care – PPO | Source: Ambulatory Visit | Attending: Family Medicine | Admitting: Family Medicine

## 2011-09-23 NOTE — Progress Notes (Addendum)
Physical Therapy Progress Note Patient Details  Name: Brenda Yang MRN: [id number] Date of Birth: 09/26/1986  Today's Date: 09/23/2011 Time: 2952-8413 Time Calculation (min): 39 min Charges: TE x 15', Manual x15', [place] x8' Visit#: 11  of 12   Re-eval: 09/23/11   Subjective Symptoms/Limitations Symptoms: Pt reports that she has been walking with her orthotics and they are helping her out a lot.  She is walking 1 mile a night and has mild pain to the medial surface of her foot.   Assessment LLE AROM (degrees) Left Ankle Dorsiflexion 0-20: 15  Left Ankle Plantar Flexion 0-45: 45  Left Ankle Inversion: 35  Left Ankle Eversion: 12   Exercise/Treatments Stretches Gastroc Stretch: Limitations Gastroc Stretch Limitations: 2 minutes Soleus Stretch: Limitations Soleus Stretch Limitations: 2 minutes Aerobic Elliptical: 8' L4 w/ortotics and shoes Standing SLS: 3x30 sec w/HHA; Balance Beam 5 RT  Modalities Modalities: Ultrasound Manual Therapy Manual Therapy: Joint mobilization Joint Mobilization: Grade III-IV mobs to 5th metatarsal w/STM to insertion of peroneus brevis. x8 minutes Ultrasound Ultrasound Location: L peroneal insertion  Ultrasound Parameters: 3 MHz (small head) 0.8w/cm2 pulsed x8 minutes   Physical Therapy Assessment and Plan PT Assessment and Plan Clinical Impression Statement: Brenda Yang has attended 11 outpatient PT visits and has made significant progress with L ankle and LLE strength, ROM, decreased pain, and improved gait.  She continues to have the greatest difficulty with balance and mild intermediate pain to the insertion of her perneous brevis which may be due to lack of strength at end range of eversion.  She has met 4/4 STG and 1/2 LTG.  She will benefit from 1 more outpatient PT visit to address remaining balance goal for a balance HEP and continue to benefit from an advanced HEP at home.  PT Plan: 1 visit to address balance.  Advanced HEP:  heel/toe walking, tandem gait, retro gait.  Manual and modalities to decrease overall pain. Then D/C - note already complete    Goals PT Short Term Goals PT Short Term Goal 1: 1.Pt will be Independent in HEP in order to maximize therapeutic effect. PT Short Term Goal 1 - Progress: Met PT Short Term Goal 2: 2.Pt will report pain less than or 2/10 for 50% of her day. PT Short Term Goal 2 - Progress: Met PT Short Term Goal 3: 3.Pt will improve ankle ROM to [facility]. PT Short Term Goal 3 - Progress: Met PT Short Term Goal 4: 4.Pt will present with improved calcaneal mobility without an increase in pain.  PT Short Term Goal 4 - Progress: Met PT Long Term Goals PT Long Term Goal 1: 1.Pt will report pain less than or equal to 2/10 for while independently walking for 60 minutes in order to participate in community and leisure activities. PT Long Term Goal 1 - Progress: Met PT Long Term Goal 2: 2.Pt will demonstrate tandem walking on uneven surface x100 ft in order to participate safely in outdoor activities.. PT Long Term Goal 2 - Progress: Not met  Problem List Patient Active Problem List  Diagnoses  . TENOSYNOVITIS OF FOOT AND ANKLE  . FOOT PAIN, LEFT  . Posterior tibial tendinitis  . Pain in joint, ankle and foot      Brenda Yang 09/23/2011, 3:50 PM  Physician Documentation Your signature is required to indicate approval of the treatment plan as stated above.  Please sign and either send electronically or make a copy of this report for your files and return this physician signed  original.   Please mark one 1.__approve of plan  2. ___approve of plan with the following conditions.   ______________________________                                                          _____________________ Physician Signature                                                                                                             Date

## 2011-09-25 ENCOUNTER — Ambulatory Visit (HOSPITAL_COMMUNITY)
Admission: RE | Admit: 2011-09-25 | Discharge: 2011-09-25 | Disposition: A | Discharge status: Home / Self Care | Payer: BC Managed Care – PPO | Source: Ambulatory Visit | Attending: Orthopedic Surgery | Admitting: Orthopedic Surgery

## 2011-09-25 DIAGNOSIS — M6281 Muscle weakness (generalized): Secondary | ICD-10-CM | POA: Insufficient documentation

## 2011-09-25 DIAGNOSIS — M25673 Stiffness of unspecified ankle, not elsewhere classified: Secondary | ICD-10-CM | POA: Insufficient documentation

## 2011-09-25 DIAGNOSIS — R262 Difficulty in walking, not elsewhere classified: Secondary | ICD-10-CM | POA: Insufficient documentation

## 2011-09-25 DIAGNOSIS — M25579 Pain in unspecified ankle and joints of unspecified foot: Secondary | ICD-10-CM | POA: Insufficient documentation

## 2011-09-25 DIAGNOSIS — M25676 Stiffness of unspecified foot, not elsewhere classified: Secondary | ICD-10-CM | POA: Insufficient documentation

## 2011-09-25 DIAGNOSIS — IMO0001 Reserved for inherently not codable concepts without codable children: Secondary | ICD-10-CM | POA: Insufficient documentation

## 2011-09-25 NOTE — Progress Notes (Signed)
Physical Therapy Treatment Patient Details  Name: Brenda Yang MRN: [id number] Date of Birth: 1986-11-12  Today's Date: 09/25/2011 Time: 2440-1027 Time Calculation (min): 40 min Visit#: 12  of 12   Re-eval:    Charge: therex: 8 min Neuro re-ed 8 min [place] 8 min Manual 8 min  Subjective: Symptoms/Limitations Symptoms: Pt reports [place] pain relief lasts for the rest of the day and the day following.  Pt stated she woke up this AM with slight pain on lateral surface of her L foot, pain scale 2/10. Pain Assessment Currently in Pain?: Yes Pain Score:   2 Pain Location: Ankle Pain Orientation: Left  Objective:  Exercise/Treatments Stretches Gastroc Stretch: 3 reps;30 seconds Soleus Stretch: 3 reps;30 seconds Aerobic Elliptical: 8' L5 w/ ortotic in shoes Standing SLS: 3x 30" sec with 1 finger HHA; L SLS 4" max of 4 Other Standing Knee Exercises: heelwalk/ toewalk 2RT Other Standing Knee Exercises: Tandem walking on Balance beam 3 RT, Retro walking 3 RT  Manual Therapy Joint Mobilization: Grade III-IV mobs to 5th metatarsal w/STM to insertion of peroneus brevis. x8 minutes  Ultrasound Ultrasound Location: L peroneal insertion Ultrasound Parameters: 3 MHz (small head) 0.8w/cm2 pulsed x8 minutes Ultrasound Goals: Pain  Physical Therapy Assessment and Plan PT Assessment and Plan Clinical Impression Statement: Pt presented with L ankle strength, ROM, improved gait and decreased pain following [place] and manual.  Pt able to complete total therex/neuro re-ed with most difficulty with SLS.  Balance still remains most difficulty. PT Plan: D/C to HEP per PT plan previous visit.     Goals    Problem List Patient Active Problem List  Diagnoses  . TENOSYNOVITIS OF FOOT AND ANKLE  . FOOT PAIN, LEFT  . Posterior tibial tendinitis  . Pain in joint, ankle and foot    PT - End of Session Activity Tolerance: Patient tolerated treatment well General Behavior During Session: [facility] for  tasks performed Cognition: [facility] for tasks performed  Brenda Yang 09/25/2011, 4:09 PM

## 2013-09-04 ENCOUNTER — Emergency Department (INDEPENDENT_AMBULATORY_CARE_PROVIDER_SITE_OTHER): Payer: BC Managed Care – PPO

## 2013-09-04 ENCOUNTER — Emergency Department (INDEPENDENT_AMBULATORY_CARE_PROVIDER_SITE_OTHER)
Admission: EM | Admit: 2013-09-04 | Discharge: 2013-09-04 | Disposition: A | Discharge status: Home / Self Care | Payer: BC Managed Care – PPO | Source: Home / Self Care | Attending: Emergency Medicine | Admitting: Emergency Medicine

## 2013-09-04 ENCOUNTER — Encounter (HOSPITAL_COMMUNITY): Payer: Self-pay | Admitting: Emergency Medicine

## 2013-09-04 DIAGNOSIS — S83106A Unspecified dislocation of unspecified knee, initial encounter: Secondary | ICD-10-CM

## 2013-09-04 DIAGNOSIS — S83104A Unspecified dislocation of right knee, initial encounter: Secondary | ICD-10-CM

## 2013-09-04 NOTE — ED Provider Notes (Signed)
Medical screening examination/treatment/procedure(s) were performed by non-physician practitioner and as supervising physician I was immediately available for consultation/collaboration.  Nels Munn, M.D.  Malonie Tatum C Idy Rawling, MD 09/04/13 1851 

## 2013-09-04 NOTE — ED Notes (Signed)
Pt reports she fell x 2 days ago and injured both knees. Fell on wooden stairs. Pt reports that the right knee is more painful and feels like she dislocated her kneecap. Pt is alert and in no acute distress.

## 2013-09-04 NOTE — ED Provider Notes (Signed)
CSN: [id number]     Arrival date & time 09/04/13  1538 History   None    Chief Complaint  Patient presents with  . Knee Pain   (Consider location/radiation/quality/duration/timing/severity/associated sxs/prior Treatment) HPI Comments: Pt fell onto R knee. Pt felt knee cap was displaced medially, then popped back into place.   Patient is a 27 y.o. female presenting with knee pain. The history is provided by the patient.  Knee Pain Location:  Knee Time since incident:  2 days Injury: yes   Mechanism of injury comment:  Fall onto knee Knee location:  R knee Pain details:    Quality:  Aching   Radiates to:  Does not radiate   Severity:  Moderate   Onset quality:  Sudden   Duration:  2 days   Timing:  Constant   Progression:  Partially resolved Chronicity:  New Dislocation: yes   Relieved by:  Nothing Worsened by:  Bearing weight Ineffective treatments:  None tried Associated symptoms: no numbness, no stiffness and no swelling     History reviewed. No pertinent past medical history. History reviewed. No pertinent past surgical history. History reviewed. No pertinent family history. History  Substance Use Topics  . Smoking status: Never Smoker   . Smokeless tobacco: Not on file  . Alcohol Use: Yes     Comment: occasionally   OB History   Grav Para Term Preterm Abortions TAB SAB Ect Mult Living                 Review of Systems  Musculoskeletal: Negative for joint swelling and stiffness.       Knee pain  Neurological: Negative for weakness and numbness.    Allergies  Review of patient's allergies indicates no known allergies.  Home Medications   Current Outpatient Rx  Name  Route  Sig  Dispense  Refill  . diclofenac (VOLTAREN) 75 MG EC tablet   Oral   Take 75 mg by mouth 2 (two) times daily.           . Multiple Vitamin (MULTIVITAMIN) tablet   Oral   Take 1 tablet by mouth daily.            BP 152/116  Pulse 82  Temp(Src) 98.9 F (37.2 C) (Oral)   Resp 17  SpO2 100%  LMP 08/10/2013 Physical Exam  Constitutional: She appears well-developed and well-nourished.  Musculoskeletal:       Right knee: She exhibits bony tenderness. She exhibits normal range of motion, no swelling, no deformity and no erythema. Tenderness found.  Skin: Skin is warm, dry and intact. Bruising noted.       ED Course  Procedures (including critical care time) Labs Review Labs Reviewed - No data to display Imaging Review Dg Knee Complete 4 Views Right  09/04/2013   CLINICAL DATA:  Right knee pain.  EXAM: RIGHT KNEE - COMPLETE 4+ VIEW  COMPARISON:  None.  FINDINGS: Imaged bones, joints and soft tissues appear normal.  IMPRESSION: Negative exam.   Electronically Signed   By: Drusilla Kanner M.D.   On: 09/04/2013 17:07    EKG Interpretation     Ventricular Rate:    PR Interval:    QRS Duration:   QT Interval:    QTC Calculation:   R Axis:     Text Interpretation:              MDM   1. Knee dislocation, right, initial encounter    bp recheck is  143/70 Knee is located at this time. Pt placed in knee immobilizer, and referred to ortho. She is pt of [facility].   Cathlyn Parsons, NP 09/04/13 1759

## 2013-09-06 ENCOUNTER — Encounter: Payer: Self-pay | Admitting: Orthopedic Surgery

## 2013-09-06 ENCOUNTER — Ambulatory Visit (INDEPENDENT_AMBULATORY_CARE_PROVIDER_SITE_OTHER): Payer: BC Managed Care – PPO | Admitting: Orthopedic Surgery

## 2013-09-06 VITALS — BP 146/86 | Ht 66.0 in | Wt 316.0 lb

## 2013-09-06 DIAGNOSIS — S83006A Unspecified dislocation of unspecified patella, initial encounter: Secondary | ICD-10-CM

## 2013-09-06 DIAGNOSIS — S83001A Unspecified subluxation of right patella, initial encounter: Secondary | ICD-10-CM

## 2013-09-06 DIAGNOSIS — S83003A Unspecified subluxation of unspecified patella, initial encounter: Secondary | ICD-10-CM | POA: Insufficient documentation

## 2013-09-06 MED ORDER — HYDROCODONE-ACETAMINOPHEN 5-325 MG PO TABS: 1.0000 | ORAL_TABLET | Freq: Three times a day (TID) | ORAL | Status: DC | PRN

## 2013-09-06 NOTE — Patient Instructions (Signed)
OOW THIS WEEK

## 2013-09-07 ENCOUNTER — Encounter: Payer: Self-pay | Admitting: Orthopedic Surgery

## 2013-09-07 NOTE — Progress Notes (Signed)
Patient ID: Brenda Yang, female   DOB: 02/23/1986, 27 y.o.   MRN: [id number]  Chief Complaint  Patient presents with  . Knee Pain    Right knee pain d/t injury 09/02/13    27 year old female first time patellar subluxation relocation on Friday, October 10 secondary to going up the stairs she fell. She complains of dull throbbing 4/10 pain she was placed in a knee immobilizer on Sunday in urgent care she complains of 4/10 dull throbbing pain with some numbness and tingling in the leg  She reports no positive findings on review of systems other than recorded in the history  No allergies  No medical problems no surgeries  No current medications  She is married she is an Geophysicist/field seismologist at [employer] she does not smoke she drinks socially she is noted to have a family history heart disease and arthritis cancer asthma and diabetes   Blood pressure 146/86, height 5\' 6"  (1.676 m), weight 316 lb (143.337 kg), last menstrual period 08/10/2013.  Vital signs: Blood pressure 146/86, height 5\' 6"  (1.676 m), weight 316 lb (143.337 kg), last menstrual period 08/10/2013.   General the patient is well-developed and well-nourished grooming and hygiene are normal Oriented x3 Mood and affect normal Ambulation normal  Inspection of the  right knee shows swelling loss of motion passive range of motion 30 patella feels stable at 20 muscle tone normal skin intact  Cardiovascular exam is normal Sensory exam normal  The x-ray that was taken shows a normal location of the knee and patella  Impression Encounter Diagnosis  Name Primary?  . Patellar subluxation, right, initial encounter Yes    Plan remove brace stat or couple more days to the end of the week go back to work Monday  Follow up in 4 weeks

## 2013-09-12 ENCOUNTER — Telehealth: Payer: Self-pay | Admitting: Orthopedic Surgery

## 2013-09-12 ENCOUNTER — Other Ambulatory Visit: Payer: Self-pay | Admitting: Orthopedic Surgery

## 2013-09-12 MED ORDER — CYCLOBENZAPRINE HCL 5 MG PO TABS: 5.0000 mg | ORAL_TABLET | Freq: Three times a day (TID) | ORAL | Status: DC | PRN

## 2013-09-12 NOTE — Telephone Encounter (Signed)
Medicine called in

## 2013-09-12 NOTE — Telephone Encounter (Signed)
Patient advised of doctor's reply °

## 2013-09-12 NOTE — Telephone Encounter (Signed)
Brenda Yang was seen last week for her right knee.  She says the swelling has gone down a lot but she is having bad cramps and muscle spasms From her right knee down to her right inner ankle.  Her next appointment is 10/06/13

## 2013-10-06 ENCOUNTER — Ambulatory Visit (INDEPENDENT_AMBULATORY_CARE_PROVIDER_SITE_OTHER): Payer: BC Managed Care – PPO | Admitting: Orthopedic Surgery

## 2013-10-06 VITALS — BP 147/77 | Ht 66.0 in | Wt 316.0 lb

## 2013-10-06 DIAGNOSIS — Z5189 Encounter for other specified aftercare: Secondary | ICD-10-CM

## 2013-10-06 DIAGNOSIS — S83004D Unspecified dislocation of right patella, subsequent encounter: Secondary | ICD-10-CM

## 2013-10-06 DIAGNOSIS — S83006A Unspecified dislocation of unspecified patella, initial encounter: Secondary | ICD-10-CM | POA: Insufficient documentation

## 2013-10-06 DIAGNOSIS — S83001A Unspecified subluxation of right patella, initial encounter: Secondary | ICD-10-CM

## 2013-10-06 MED ORDER — CYCLOBENZAPRINE HCL 5 MG PO TABS: 5.0000 mg | ORAL_TABLET | Freq: Three times a day (TID) | ORAL | Status: DC | PRN

## 2013-10-06 NOTE — Patient Instructions (Signed)
Do home exercises    

## 2013-10-06 NOTE — Progress Notes (Signed)
Patient ID: Brenda Yang, female   DOB: 23-Mar-1986, 27 y.o.   MRN: [id number]  Encounter Diagnoses  Name Primary?  . Patellar subluxation, right, initial encounter Yes  . Closed dislocation of patella, right, subsequent encounter     Chief Complaint  Patient presents with  . Follow-up    4 week recheck right knee patella subluxation DOI 09/02/13    BP 147/77  Ht 5\' 6"  (1.676 m)  Wt 316 lb (143.337 kg)  BMI 51.03 kg/m2  The patient is a 27 year old female first time patellar subluxation relocation of Friday, October 10 after falling down some stairs. She was treated nonoperatively with a brace. She comes in for reevaluation.  Complains of pain over the medial patella no subluxation dislocation episodes, complains of some tightness.  BP 147/77  Ht 5\' 6"  (1.676 m)  Wt 316 lb (143.337 kg)  BMI 51.03 kg/m2 General appearance is normal, the patient is alert and oriented x3 with normal mood and affect. She is tenderness over the medial retinaculum. No apprehension no instability full passive range of motion cruciate and carotid collateral ligaments are stable  Impression post patellar subluxation dislocation  Quadriceps exercises followup in a month

## 2013-10-10 ENCOUNTER — Telehealth: Payer: Self-pay | Admitting: Orthopedic Surgery

## 2013-10-10 NOTE — Telephone Encounter (Signed)
Patient called to inquire about having Dr. Romeo Apple order Voltaren gel, which she said she heard about and thought may help her knee.  If so, her pharmacy is [facility].  Her ph# is [phone].

## 2013-10-11 ENCOUNTER — Other Ambulatory Visit: Payer: Self-pay | Admitting: *Deleted

## 2013-10-11 DIAGNOSIS — S83001A Unspecified subluxation of right patella, initial encounter: Secondary | ICD-10-CM

## 2013-10-11 DIAGNOSIS — M25561 Pain in right knee: Secondary | ICD-10-CM

## 2013-10-11 MED ORDER — DICLOFENAC SODIUM 1 % TD GEL: 4.0000 g | Freq: Four times a day (QID) | TRANSDERMAL | Status: DC

## 2013-10-11 NOTE — Telephone Encounter (Signed)
Routing to Dr Harrison 

## 2013-10-11 NOTE — Telephone Encounter (Signed)
Try may not be approved

## 2013-10-11 NOTE — Telephone Encounter (Signed)
Prescription for Voltaren gel 4 g 4 times daily sent to pharmacy per Dr. Romeo Apple.

## 2013-11-08 ENCOUNTER — Ambulatory Visit: Payer: BC Managed Care – PPO | Admitting: Orthopedic Surgery

## 2014-01-18 ENCOUNTER — Encounter (HOSPITAL_COMMUNITY): Payer: Self-pay | Admitting: Emergency Medicine

## 2014-01-18 ENCOUNTER — Emergency Department (HOSPITAL_COMMUNITY)
Admission: EM | Admit: 2014-01-18 | Discharge: 2014-01-18 | Disposition: A | Discharge status: Home / Self Care | Payer: BC Managed Care – PPO | Source: Home / Self Care | Attending: Family Medicine | Admitting: Family Medicine

## 2014-01-18 DIAGNOSIS — S83001A Unspecified subluxation of right patella, initial encounter: Secondary | ICD-10-CM

## 2014-01-18 DIAGNOSIS — M545 Low back pain, unspecified: Secondary | ICD-10-CM

## 2014-01-18 DIAGNOSIS — S83006A Unspecified dislocation of unspecified patella, initial encounter: Secondary | ICD-10-CM

## 2014-01-18 MED ORDER — TRAMADOL HCL 50 MG PO TABS: 50.0000 mg | ORAL_TABLET | Freq: Four times a day (QID) | ORAL | Status: DC | PRN

## 2014-01-18 MED ORDER — PREDNISONE 10 MG PO KIT: PACK | ORAL | Status: DC

## 2014-01-18 MED ORDER — CYCLOBENZAPRINE HCL 5 MG PO TABS: 5.0000 mg | ORAL_TABLET | Freq: Every evening | ORAL | Status: DC | PRN

## 2014-01-18 NOTE — ED Notes (Signed)
C/o back pain States she was working in the yard yesterday when she feels as if she twisted her back yesterday Stated she did do a lot of heavy lifting also Hydrocodone, flexeril and heating pad was used as tx

## 2014-01-18 NOTE — ED Provider Notes (Signed)
Brenda Yang is a 28 y.o. female who presents to Urgent Care today for diffuse low back pain. Patient felt a pulling sensation in her low back while carrying to heavy buckets. This occurred yesterday. She notes the pain is mostly confined to her low back it does radiate to the bilateral thighs. She denies any pain radiating down her leg test her knees weakness numbness bowel bladder dysfunction or difficulty walking. She's tried leftover Norco and Flexeril which has not helped much. She denies any fevers or chills nausea vomiting or diarrhea. She feels well otherwise. She denies any significant injury or fall.   History reviewed. No pertinent past medical history. History  Substance Use Topics  . Smoking status: Never Smoker   . Smokeless tobacco: Not on file  . Alcohol Use: Yes     Comment: occasionally   ROS as above Medications: No current facility-administered medications for this encounter.   Current Outpatient Prescriptions  Medication Sig Dispense Refill  . cyclobenzaprine (FLEXERIL) 5 MG tablet Take 1 tablet (5 mg total) by mouth at bedtime as needed for muscle spasms.  20 tablet  0  . Multiple Vitamin (MULTIVITAMIN) tablet Take 1 tablet by mouth daily.        . PredniSONE 10 MG KIT 12 day dose pack po  1 kit  0  . traMADol (ULTRAM) 50 MG tablet Take 1 tablet (50 mg total) by mouth every 6 (six) hours as needed.  15 tablet  0    Exam:  BP 183/106  Pulse 91  Temp(Src) 98.1 F (36.7 C) (Oral)  Resp 18  SpO2 100%  LMP 01/16/2014 Gen: Well NAD obese HEENT: EOMI,  MMM Lungs: Normal work of breathing. CTABL Heart: RRR no MRG Abd: NABS, Soft. NT, ND Exts: Brisk capillary refill, warm and well perfused.  Back: Nontender to spinal midline. Tender palpation bilateral SI joint. Range of motion is normal to extension rotation but limited flexion due to pain Negative straight leg raise test. Negative Faber test and pretzel stretch bilaterally. Reflexes are equal bilateral  knees and ankles. Strength is intact bilateral lower extremities. Sensation is intact throughout.   Assessment and Plan: 28 y.o. female with lumbago due to myofascial disruption. Plan to treat with prednisone and tramadol as well as Flexeril and physical therapy. Avoid NSAIDs due to elevated blood pressure. Followup with primary care provider ASAP  Discussed warning signs or symptoms. Please see discharge instructions. Patient expresses understanding.    Gregor Hams, MD 01/18/14 1120

## 2014-01-18 NOTE — Discharge Instructions (Signed)
Thank you for coming in today. Take medications as directed Use a heating pad Physical therapy should contact you in a few days. If you have not heard from them please call the [address] branch of [facility]. Come back or go to the emergency room if you notice new weakness new numbness problems walking or bowel or bladder problems.  Back Exercises Back exercises help treat and prevent back injuries. The goal is to increase your strength in your belly (abdominal) and back muscles. These exercises can also help with flexibility. Start these exercises when told by your doctor. HOME CARE Back exercises include: Pelvic Tilt.  Lie on your back with your knees bent. Tilt your pelvis until the lower part of your back is against the floor. Hold this position 5 to 10 sec. Repeat this exercise 5 to 10 times. Knee to Chest.  Pull 1 knee up against your chest and hold for 20 to 30 seconds. Repeat this with the other knee. This may be done with the other leg straight or bent, whichever feels better. Then, pull both knees up against your chest. Sit-Ups or Curl-Ups.  Bend your knees 90 degrees. Start with tilting your pelvis, and do a partial, slow sit-up. Only lift your upper half 30 to 45 degrees off the floor. Take at least 2 to 3 seonds for each sit-up. Do not do sit-ups with your knees out straight. If partial sit-ups are difficult, simply do the above but with only tightening your belly (abdominal) muscles and holding it as told. Hip-Lift.  Lie on your back with your knees flexed 90 degrees. Push down with your feet and shoulders as you raise your hips 2 inches off the floor. Hold for 10 seconds, repeat 5 to 10 times. Back Arches.  Lie on your stomach. Prop yourself up on bent elbows. Slowly press on your hands, causing an arch in your low back. Repeat 3 to 5 times. Shoulder-Lifts.  Lie face down with arms beside your body. Keep hips and belly pressed to floor as you  slowly lift your head and shoulders off the floor. Do not overdo your exercises. Be careful in the beginning. Exercises may cause you some mild back discomfort. If the pain lasts for more than 15 minutes, stop the exercises until you see your doctor. Improvement with exercise for back problems is slow.  Document Released: 12/13/2010 Document Revised: 02/02/2012 Document Reviewed: 09/11/2011 [facility] Patient Information 2014 [place], [place].

## 2014-11-11 IMAGING — CR DG KNEE COMPLETE 4+V*R*
4 series · 4 of 4 positions shown · non-contrast
Comparison: None.

CLINICAL DATA: Right knee pain.

EXAM:
RIGHT KNEE - COMPLETE 4+ VIEW

[view not recorded (1 of 4)]
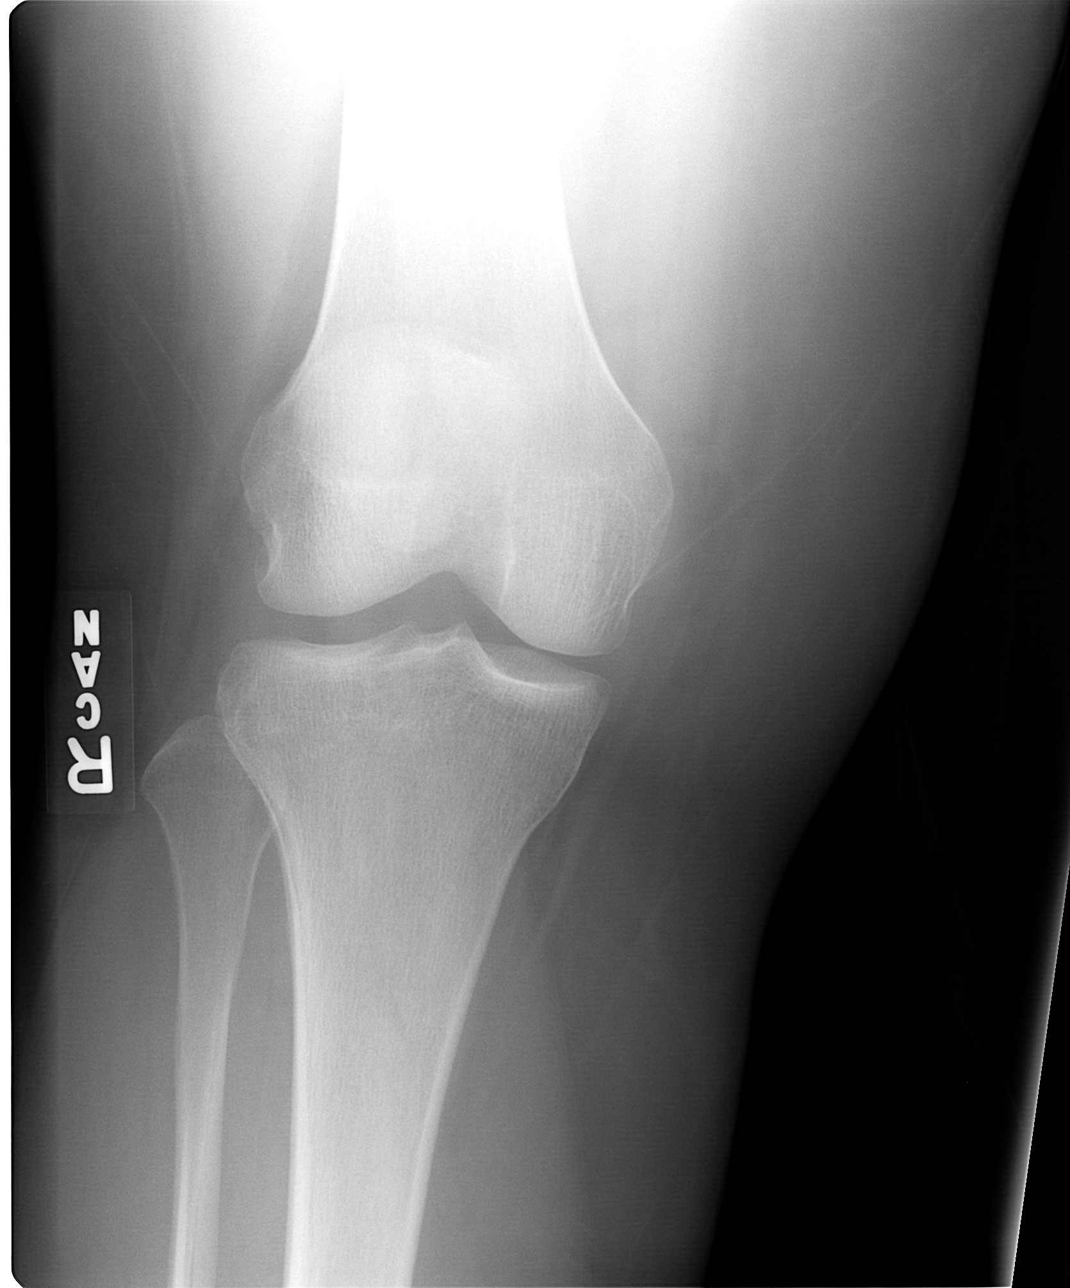

[view not recorded (2 of 4)]
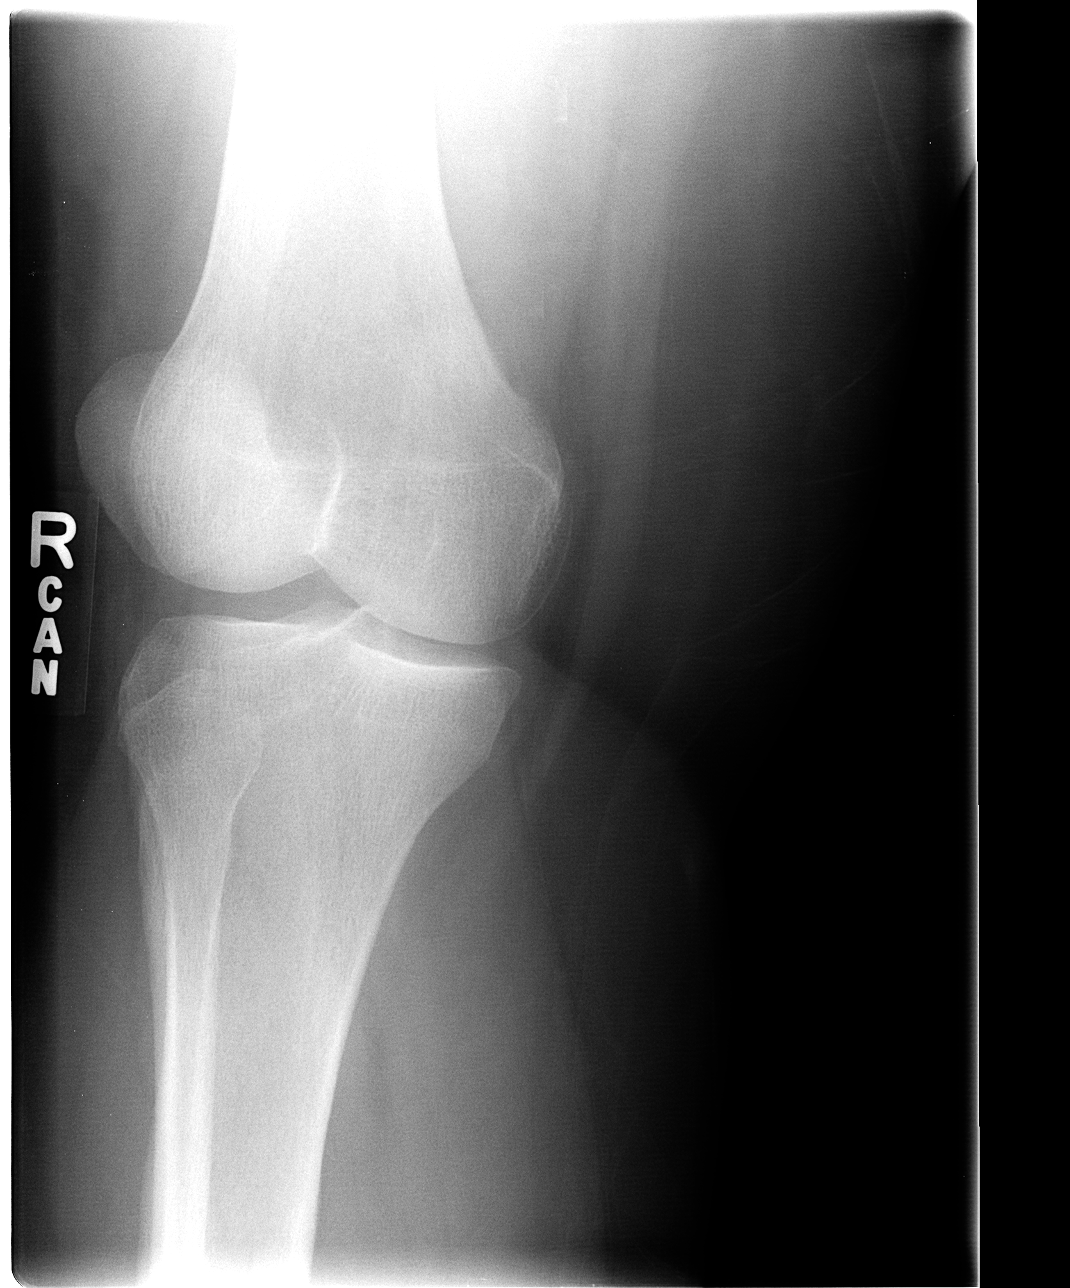

[view not recorded (3 of 4)]
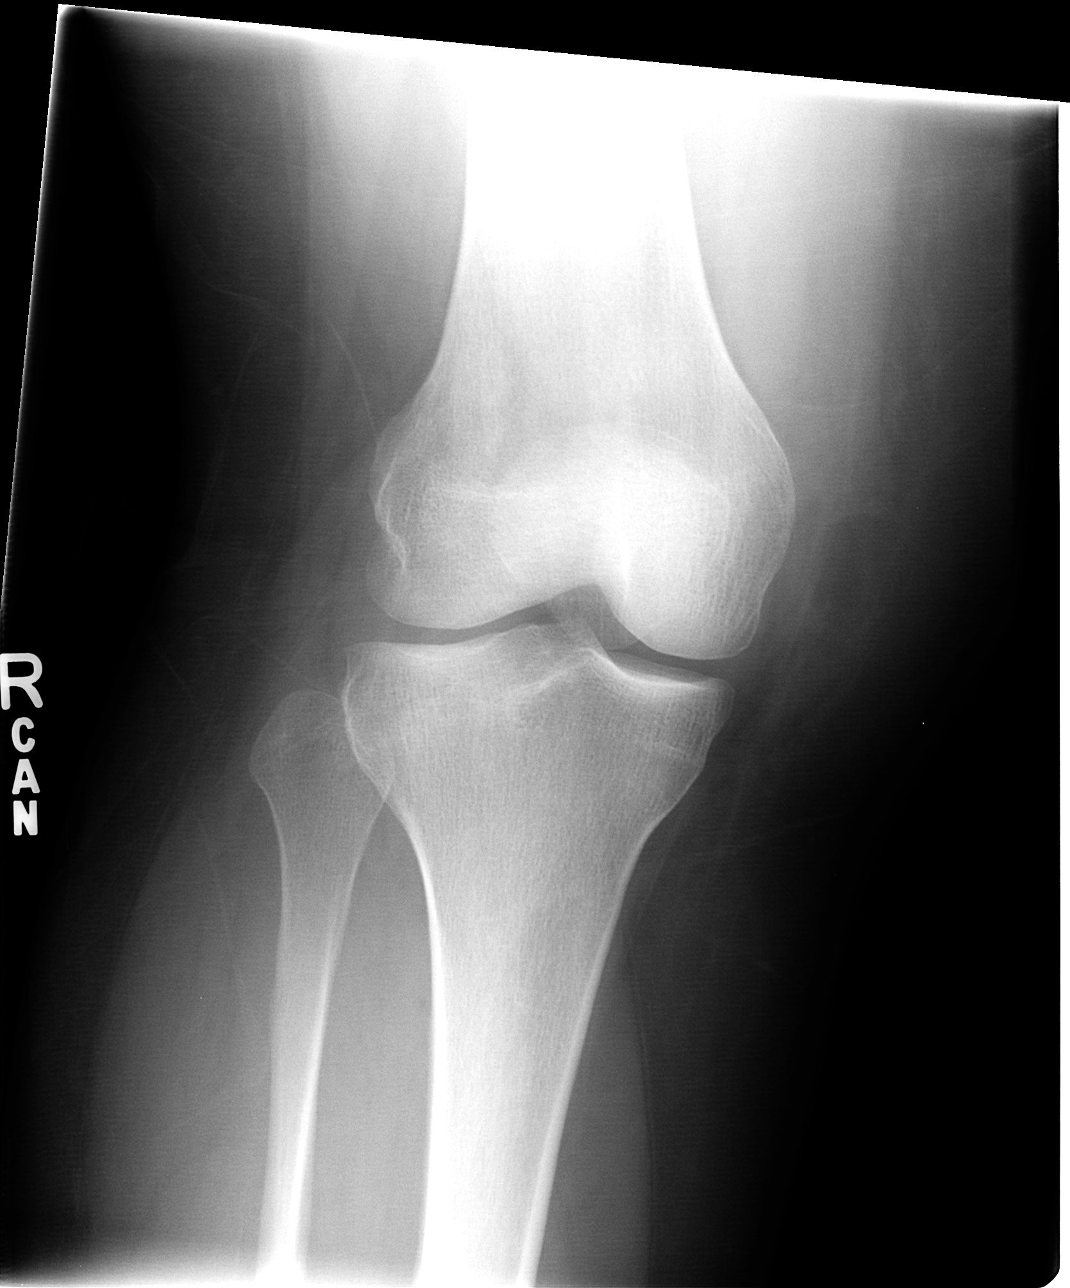

[view not recorded (4 of 4)]
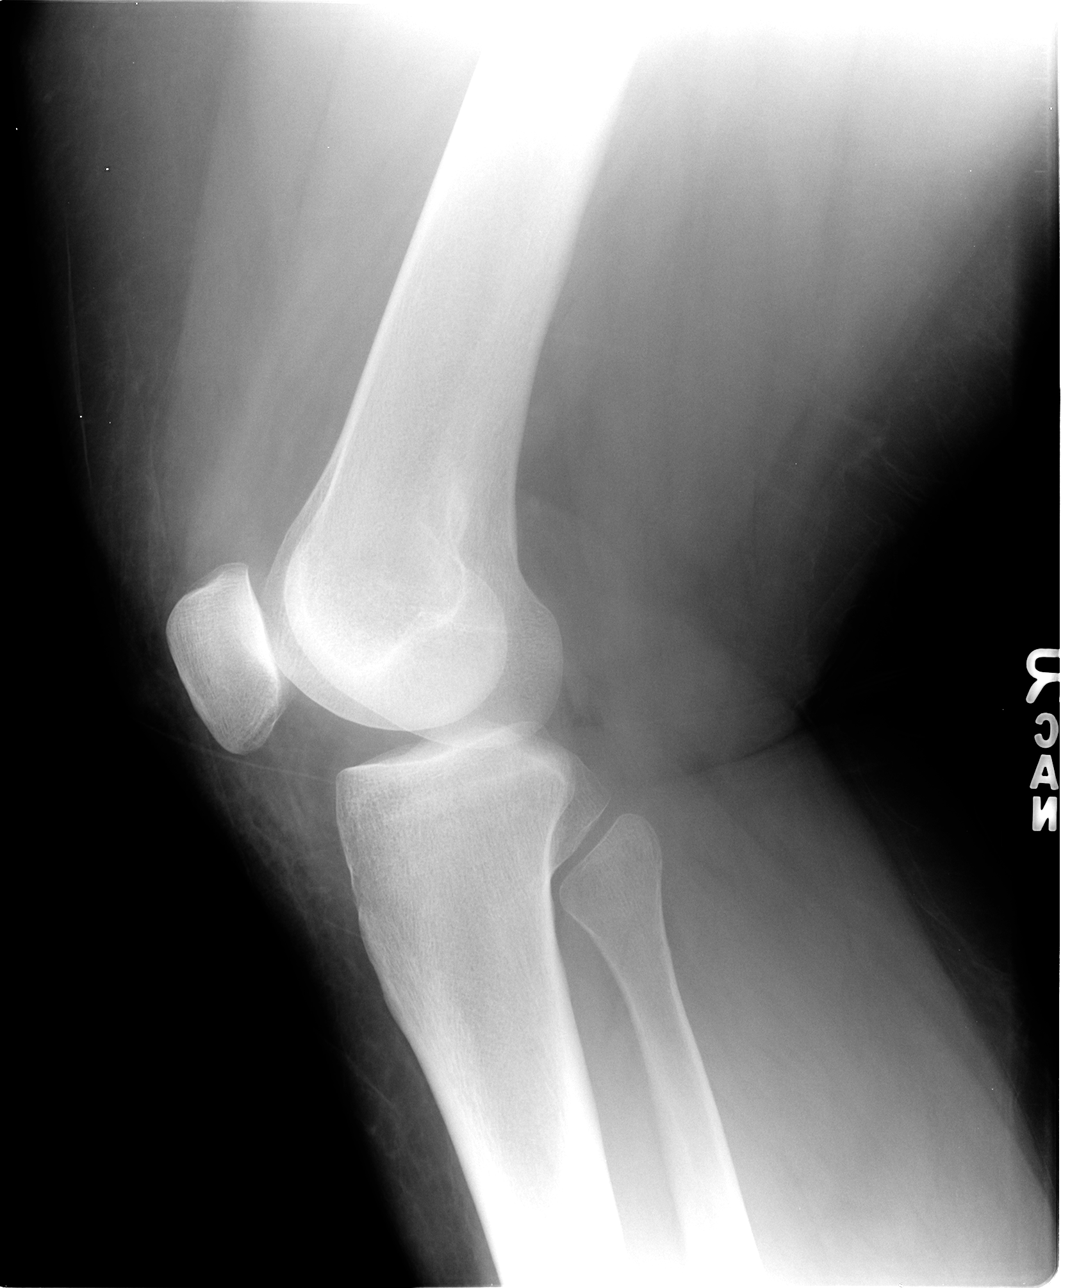

[4 of 4 positions shown; findings below may reference images not displayed]

FINDINGS: Imaged bones, joints and soft tissues appear normal.
IMPRESSION: Negative exam.

## 2015-04-11 ENCOUNTER — Emergency Department (HOSPITAL_COMMUNITY)
Admission: EM | Admit: 2015-04-11 | Discharge: 2015-04-11 | Disposition: A | Discharge status: Home / Self Care | Payer: 59 | Source: Home / Self Care | Attending: Family Medicine | Admitting: Family Medicine

## 2015-04-11 ENCOUNTER — Encounter (HOSPITAL_COMMUNITY): Payer: Self-pay | Admitting: Emergency Medicine

## 2015-04-11 DIAGNOSIS — J40 Bronchitis, not specified as acute or chronic: Secondary | ICD-10-CM | POA: Diagnosis not present

## 2015-04-11 MED ORDER — ALBUTEROL SULFATE HFA 108 (90 BASE) MCG/ACT IN AERS: 2.0000 | INHALATION_SPRAY | Freq: Four times a day (QID) | RESPIRATORY_TRACT | Status: AC | PRN

## 2015-04-11 MED ORDER — IPRATROPIUM-ALBUTEROL 0.5-2.5 (3) MG/3ML IN SOLN
3.0000 mL | Freq: Once | RESPIRATORY_TRACT | Status: AC
  Administered 2015-04-11: 3 mL via RESPIRATORY_TRACT

## 2015-04-11 MED ORDER — IPRATROPIUM-ALBUTEROL 0.5-2.5 (3) MG/3ML IN SOLN
RESPIRATORY_TRACT | Status: AC
  Filled 2015-04-11: qty 3

## 2015-04-11 MED ORDER — GUAIFENESIN-CODEINE 100-10 MG/5ML PO SOLN: 5.0000 mL | Freq: Every evening | ORAL | Status: DC | PRN

## 2015-04-11 MED ORDER — PREDNISONE 50 MG PO TABS: 50.0000 mg | ORAL_TABLET | Freq: Every day | ORAL | Status: DC

## 2015-04-11 NOTE — Discharge Instructions (Signed)
Thank you for coming in today. °Call or go to the emergency room if you get worse, have trouble breathing, have chest pains, or palpitations.  ° °Acute Bronchitis °Bronchitis is inflammation of the airways that extend from the windpipe into the lungs (bronchi). The inflammation often causes mucus to develop. This leads to a cough, which is the most common symptom of bronchitis.  °In acute bronchitis, the condition usually develops suddenly and goes away over time, usually in a couple weeks. Smoking, allergies, and asthma can make bronchitis worse. Repeated episodes of bronchitis may cause further lung problems.  °CAUSES °Acute bronchitis is most often caused by the same virus that causes a cold. The virus can spread from person to person (contagious) through coughing, sneezing, and touching contaminated objects. °SIGNS AND SYMPTOMS  °· Cough.   °· Fever.   °· Coughing up mucus.   °· Body aches.   °· Chest congestion.   °· Chills.   °· Shortness of breath.   °· Sore throat.   °DIAGNOSIS  °Acute bronchitis is usually diagnosed through a physical exam. Your health care provider will also ask you questions about your medical history. Tests, such as chest X-rays, are sometimes done to rule out other conditions.  °TREATMENT  °Acute bronchitis usually goes away in a couple weeks. Oftentimes, no medical treatment is necessary. Medicines are sometimes given for relief of fever or cough. Antibiotic medicines are usually not needed but may be prescribed in certain situations. In some cases, an inhaler may be recommended to help reduce shortness of breath and control the cough. A cool mist vaporizer may also be used to help thin bronchial secretions and make it easier to clear the chest.  °HOME CARE INSTRUCTIONS °· Get plenty of rest.   °· Drink enough fluids to keep your urine clear or pale yellow (unless you have a medical condition that requires fluid restriction). Increasing fluids may help thin your respiratory secretions  (sputum) and reduce chest congestion, and it will prevent dehydration.   °· Take medicines only as directed by your health care provider. °· If you were prescribed an antibiotic medicine, finish it all even if you start to feel better. °· Avoid smoking and secondhand smoke. Exposure to cigarette smoke or irritating chemicals will make bronchitis worse. If you are a smoker, consider using nicotine gum or skin patches to help control withdrawal symptoms. Quitting smoking will help your lungs heal faster.   °· Reduce the chances of another bout of acute bronchitis by washing your hands frequently, avoiding people with cold symptoms, and trying not to touch your hands to your mouth, nose, or eyes.   °· Keep all follow-up visits as directed by your health care provider.   °SEEK MEDICAL CARE IF: °Your symptoms do not improve after 1 week of treatment.  °SEEK IMMEDIATE MEDICAL CARE IF: °· You develop an increased fever or chills.   °· You have chest pain.   °· You have severe shortness of breath. °· You have bloody sputum.   °· You develop dehydration. °· You faint or repeatedly feel like you are going to pass out. °· You develop repeated vomiting. °· You develop a severe headache. °MAKE SURE YOU:  °· Understand these instructions. °· Will watch your condition. °· Will get help right away if you are not doing well or get worse. °Document Released: 12/18/2004 Document Revised: 03/27/2014 Document Reviewed: 05/03/2013 °ExitCare® Patient Information ©2015 [employer]. This information is not intended to replace advice given to you by your health care provider. Make sure you discuss any questions you have with your   health care provider. ° °

## 2015-04-11 NOTE — ED Provider Notes (Signed)
Brenda Yang is a 29 y.o. female who presents to Urgent Care today for wheezing and shortness of breath cough and congestion. Symptoms present for one day. Symptoms are consistent with previous episodes of bronchitis. No chest pains or palpitations fevers or chills. She has tried some of her grandmother's albuterol and codeine cough syrup which seemed to help. She feels well otherwise.   History reviewed. No pertinent past medical history. History reviewed. No pertinent past surgical history. History  Substance Use Topics  . Smoking status: Never Smoker   . Smokeless tobacco: Not on file  . Alcohol Use: Yes     Comment: occasionally   ROS as above Medications: No current facility-administered medications for this encounter.   Current Outpatient Prescriptions  Medication Sig Dispense Refill  . Multiple Vitamin (MULTIVITAMIN) tablet Take 1 tablet by mouth daily.      Marland Kitchen. albuterol (PROVENTIL HFA;VENTOLIN HFA) 108 (90 BASE) MCG/ACT inhaler Inhale 2 puffs into the lungs every 6 (six) hours as needed for wheezing or shortness of breath. 1 Inhaler 2  . guaiFENesin-codeine 100-10 MG/5ML syrup Take 5 mLs by mouth at bedtime as needed for cough. 120 mL 0  . predniSONE (DELTASONE) 50 MG tablet Take 1 tablet (50 mg total) by mouth daily. 5 tablet 0   No Known Allergies   Exam:  BP 164/102 mmHg  Pulse 95  Temp(Src) 98.3 F (36.8 C) (Oral)  Resp 16  SpO2 95%  LMP 03/13/2015 Gen: Well NAD HEENT: EOMI,  MMM clear nasal discharge. Normal posterior pharynx and tympanic membranes Lungs: Normal work of breathing. Wheezing present bilaterally with prolonged expiratory phase. Heart: RRR no MRG Abd: NABS, Soft. Nondistended, Nontender Exts: Brisk capillary refill, warm and well perfused.   Patient was given a 2.5/0.355ml duoneb neb treatment and felt better.   No results found for this or any previous visit (from the past 24 hour(s)). No results found.  Assessment and Plan: 29 y.o. female  with bronchitis. Treat with prednisone and albuterol and codeine cough syrup provided. Return as needed. Recommend follow-up with PCP for blood pressure  Discussed warning signs or symptoms. Please see discharge instructions. Patient expresses understanding.     Rodolph BongEvan S Damani Kelemen, MD 04/11/15 [phone]

## 2015-04-11 NOTE — ED Notes (Signed)
C/o cold sx onset yest am Sx include prod cough, SOB, chest d/c, fevers, runny nose, congestion Has been using rescue inhaler w/no relief Alert, no signs of acute distress.

## 2016-04-25 DIAGNOSIS — I10 Essential (primary) hypertension: Secondary | ICD-10-CM | POA: Insufficient documentation

## 2016-05-14 DIAGNOSIS — E282 Polycystic ovarian syndrome: Secondary | ICD-10-CM | POA: Insufficient documentation

## 2017-03-12 ENCOUNTER — Other Ambulatory Visit: Payer: Self-pay

## 2017-03-12 ENCOUNTER — Encounter (HOSPITAL_COMMUNITY): Payer: Self-pay | Admitting: Emergency Medicine

## 2017-03-12 ENCOUNTER — Emergency Department (HOSPITAL_COMMUNITY)
Admission: EM | Admit: 2017-03-12 | Discharge: 2017-03-12 | Disposition: A | Discharge status: Home / Self Care | Payer: No Typology Code available for payment source | Attending: Emergency Medicine | Admitting: Emergency Medicine

## 2017-03-12 ENCOUNTER — Emergency Department (HOSPITAL_COMMUNITY): Payer: No Typology Code available for payment source

## 2017-03-12 DIAGNOSIS — R0781 Pleurodynia: Secondary | ICD-10-CM | POA: Diagnosis not present

## 2017-03-12 DIAGNOSIS — R739 Hyperglycemia, unspecified: Secondary | ICD-10-CM | POA: Insufficient documentation

## 2017-03-12 DIAGNOSIS — R072 Precordial pain: Secondary | ICD-10-CM | POA: Diagnosis present

## 2017-03-12 DIAGNOSIS — Z79899 Other long term (current) drug therapy: Secondary | ICD-10-CM | POA: Diagnosis not present

## 2017-03-12 LAB — POC URINE PREG, ED: PREG TEST UR: NEGATIVE

## 2017-03-12 LAB — BASIC METABOLIC PANEL
ANION GAP: 6 (ref 5–15)
BUN: 10 mg/dL (ref 6–20)
CO2: 27 mmol/L (ref 22–32)
Calcium: 8.8 mg/dL — ABNORMAL LOW (ref 8.9–10.3)
Chloride: 104 mmol/L (ref 101–111)
Creatinine, Ser: 0.9 mg/dL (ref 0.44–1.00)
GLUCOSE: 209 mg/dL — AB (ref 65–99)
POTASSIUM: 4.1 mmol/L (ref 3.5–5.1)
Sodium: 137 mmol/L (ref 135–145)

## 2017-03-12 LAB — CBC
HCT: 38.7 % (ref 36.0–46.0)
HEMOGLOBIN: 12.6 g/dL (ref 12.0–15.0)
MCH: 27.6 pg (ref 26.0–34.0)
MCHC: 32.6 g/dL (ref 30.0–36.0)
MCV: 84.9 fL (ref 78.0–100.0)
Platelets: 197 10*3/uL (ref 150–400)
RBC: 4.56 MIL/uL (ref 3.87–5.11)
RDW: 13.3 % (ref 11.5–15.5)
WBC: 8 10*3/uL (ref 4.0–10.5)

## 2017-03-12 LAB — URINALYSIS, ROUTINE W REFLEX MICROSCOPIC
Bilirubin Urine: NEGATIVE
Glucose, UA: NEGATIVE mg/dL
Hgb urine dipstick: NEGATIVE
Ketones, ur: NEGATIVE mg/dL
Leukocytes, UA: NEGATIVE
NITRITE: NEGATIVE
PROTEIN: NEGATIVE mg/dL
Specific Gravity, Urine: 1.016 (ref 1.005–1.030)
pH: 7 (ref 5.0–8.0)

## 2017-03-12 LAB — D-DIMER, QUANTITATIVE: D-Dimer, Quant: 0.45 ug/mL-FEU (ref 0.00–0.50)

## 2017-03-12 LAB — I-STAT TROPONIN, ED: Troponin i, poc: 0 ng/mL (ref 0.00–0.08)

## 2017-03-12 MED ORDER — CYCLOBENZAPRINE HCL 10 MG PO TABS: 10.0000 mg | ORAL_TABLET | Freq: Two times a day (BID) | ORAL | 0 refills | Status: DC | PRN

## 2017-03-12 MED ORDER — NAPROXEN 500 MG PO TABS: 500.0000 mg | ORAL_TABLET | Freq: Two times a day (BID) | ORAL | 0 refills | Status: DC

## 2017-03-12 NOTE — ED Provider Notes (Signed)
[facility]-EMERGENCY DEPT Provider Note   CSN: [id number] Arrival date & time: 03/12/17  1235  By signing my name below, I, Phillips Climes, attest that this documentation has been prepared under the direction and in the presence of Linwood Dibbles, MD . Electronically Signed: Phillips Climes, Scribe. 03/12/2017. 1:08 PM.  History   Chief Complaint Chief Complaint  Patient presents with  . Chest Pain    The history is provided by the patient and a relative. No language interpreter was used.   HPI Comments:  Brenda Yang is a 31 y.o. female with no significant past medical history who presents to the Emergency Department complaining of lower sternum chest pain with intermittent radiation to the back. Pain is worse with deep inspirations. Pt additionally endorses chest spasms and cough related to her current allergies, but denies any SOB. She was referred here from an urgent care in order to r/o pulmonary embolism. Pt denies use of any estrogen medications or birth control. No hx of DVT, PE or immediate family history of either. Grandmother, who in the room, reports a hx of DVT secondary to Factor V clotting disorder.     History reviewed. No pertinent past medical history.  Patient Active Problem List   Diagnosis Date Noted  . Closed dislocation of patella 10/06/2013  . 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 Posterior tibial tendinitis 08/08/2011  . Pain in joint, ankle and foot 08/08/2011  . TENOSYNOVITIS OF FOOT AND ANKLE 02/05/2011  . FOOT PAIN, LEFT 02/05/2011    History reviewed. No pertinent surgical history.  OB History    No data available       Home Medications    Prior to Admission medications   Medication Sig Start Date End Date Taking? Authorizing Provider  albuterol (PROVENTIL HFA;VENTOLIN HFA) 108 (90 BASE) MCG/ACT inhaler Inhale 2 puffs into the lungs every 6 (six) hours as needed for wheezing or shortness of breath. 04/11/15   Rodolph Bong, MD  cyclobenzaprine  (FLEXERIL) 10 MG tablet Take 1 tablet (10 mg total) by mouth 2 (two) times daily as needed for muscle spasms. 03/12/17   Linwood Dibbles, MD  guaiFENesin-codeine 100-10 MG/5ML syrup Take 5 mLs by mouth at bedtime as needed for cough. 04/11/15   Rodolph Bong, MD  Multiple Vitamin (MULTIVITAMIN) tablet Take 1 tablet by mouth daily.      Historical Provider, MD  naproxen (NAPROSYN) 500 MG tablet Take 1 tablet (500 mg total) by mouth 2 (two) times daily with a meal. As needed for pain 03/12/17   Linwood Dibbles, MD  predniSONE (DELTASONE) 50 MG tablet Take 1 tablet (50 mg total) by mouth daily. 04/11/15   Rodolph Bong, MD    Family History History reviewed. No pertinent family history.  Social History Social History  Substance Use Topics  . Smoking status: Never Smoker  . Smokeless tobacco: Not on file  . Alcohol use Yes     Comment: occasionally     Allergies   Patient has no known allergies.   Review of Systems Review of Systems   Physical Exam Updated Vital Signs BP (!) 135/56 (BP Location: Left Wrist)   Pulse 82   Temp 98.7 F (37.1 C) (Oral)   Resp 16   LMP 03/03/2017   SpO2 95%   Physical Exam  Constitutional: She appears well-developed and well-nourished. No distress.  Obese   HENT:  Head: Normocephalic and atraumatic.  Right Ear: External ear normal.  Left Ear: External ear normal.  Eyes: Conjunctivae are normal. Right eye exhibits no discharge. Left eye exhibits no discharge. No scleral icterus.  Neck: Neck supple. No tracheal deviation present.  Cardiovascular: Normal rate, regular rhythm and intact distal pulses.   Pulmonary/Chest: Effort normal and breath sounds normal. No stridor. No respiratory distress. She has no wheezes. She has no rales.  Abdominal: Soft. Bowel sounds are normal. She exhibits no distension. There is no tenderness. There is no rebound and no guarding.  Musculoskeletal: She exhibits no edema or tenderness.  Neurological: She is alert. She has normal  strength. No cranial nerve deficit (no facial droop, extraocular movements intact, no slurred speech) or sensory deficit. She exhibits normal muscle tone. She displays no seizure activity. Coordination normal.  Skin: Skin is warm and dry. No rash noted.  Psychiatric: She has a normal mood and affect.  Nursing note and vitals reviewed.    ED Treatments / Results  DIAGNOSTIC STUDIES:  Oxygen Saturation is 95% on room air, adequate by my interpretation.    COORDINATION OF CARE:  1:05 PM Discussed treatment plan with pt at bedside and pt agreed to plan.  Labs (all labs ordered are listed, but only abnormal results are displayed) Labs Reviewed  BASIC METABOLIC PANEL - Abnormal; Notable for the following:       Result Value   Glucose, Bld 209 (*)    Calcium 8.8 (*)    All other components within normal limits  CBC  D-DIMER, QUANTITATIVE (NOT AT [facility])  URINALYSIS, ROUTINE W REFLEX MICROSCOPIC  I-STAT TROPOININ, ED  POC URINE PREG, ED    EKG  EKG Interpretation  Date/Time:  Thursday March 12 2017 12:40:50 EDT Ventricular Rate:  86 PR Interval:  132 QRS Duration: 78 QT Interval:  348 QTC Calculation: 416 R Axis:   43 Text Interpretation:  Normal sinus rhythm Nonspecific T wave abnormality Abnormal ECG No old tracing to compare Confirmed by Kateline Kinkade  MD-J, Dima Mini ([id number]) on 03/12/2017 12:58:53 PM Also confirmed by [facility]  MD-J, Laniesha Das [phone]), editor Misty Stanley [phone])  on 03/12/2017 1:40:25 PM       Radiology Dg Chest 2 View  Result Date: 03/12/2017 CLINICAL DATA:  Chest pain short of breath EXAM: CHEST  2 VIEW COMPARISON:  None. FINDINGS: Median sternotomy with clips in the anterior mediastinum. Heart size within normal limits. Negative for heart failure. Lungs are clear without infiltrate effusion or mass. IMPRESSION: No active cardiopulmonary disease. Electronically Signed   By: Marlan Palau M.D.   On: 03/12/2017 13:26    Procedures Procedures (including critical care  time)  Medications Ordered in ED Medications - No data to display   Initial Impression / Assessment and Plan / ED Course  I have reviewed the triage vital signs and the nursing notes.  Pertinent labs & imaging results that were available during my care of the patient were reviewed by me and considered in my medical decision making (see chart for details).   Sx are pleuritic in nature.  Doubt cardiac etiology.  No pna on cxr.  Low risk for PE.   D dimer negative.  Doubt PE. Will treat with nsaids.  Pt also requests muscle relaxants.  Elevated blood sugar also discussed.  Pt will need follow up with her PCP.  Discussed dietary changes etc.  Final Clinical Impressions(s) / ED Diagnoses   Final diagnoses:  Pleuritic chest pain  Hyperglycemia    New Prescriptions New Prescriptions   CYCLOBENZAPRINE (FLEXERIL) 10 MG TABLET    Take 1 tablet (  10 mg total) by mouth 2 (two) times daily as needed for muscle spasms.   NAPROXEN (NAPROSYN) 500 MG TABLET    Take 1 tablet (500 mg total) by mouth 2 (two) times daily with a meal. As needed for pain  I personally performed the services described in this documentation, which was scribed in my presence.  The recorded information has been reviewed and is accurate.    Linwood Dibbles, MD 03/12/17 1524

## 2017-03-12 NOTE — Discharge Instructions (Signed)
Take medications as prescribed, follow-up with your primary care doctor if the symptoms are not better by next week, otherwise follow up routinely on the elevated blood sugar that we discussed today.

## 2017-03-12 NOTE — ED Notes (Signed)
States she was seen  At urgent care today with chest pain however states the pain was worse with inspiration and cough.

## 2018-05-19 IMAGING — CR DG CHEST 2V
2 series · 2 of 2 positions shown · non-contrast
Comparison: None.

CLINICAL DATA: Chest pain short of breath

EXAM:
CHEST  2 VIEW

[chest pa]
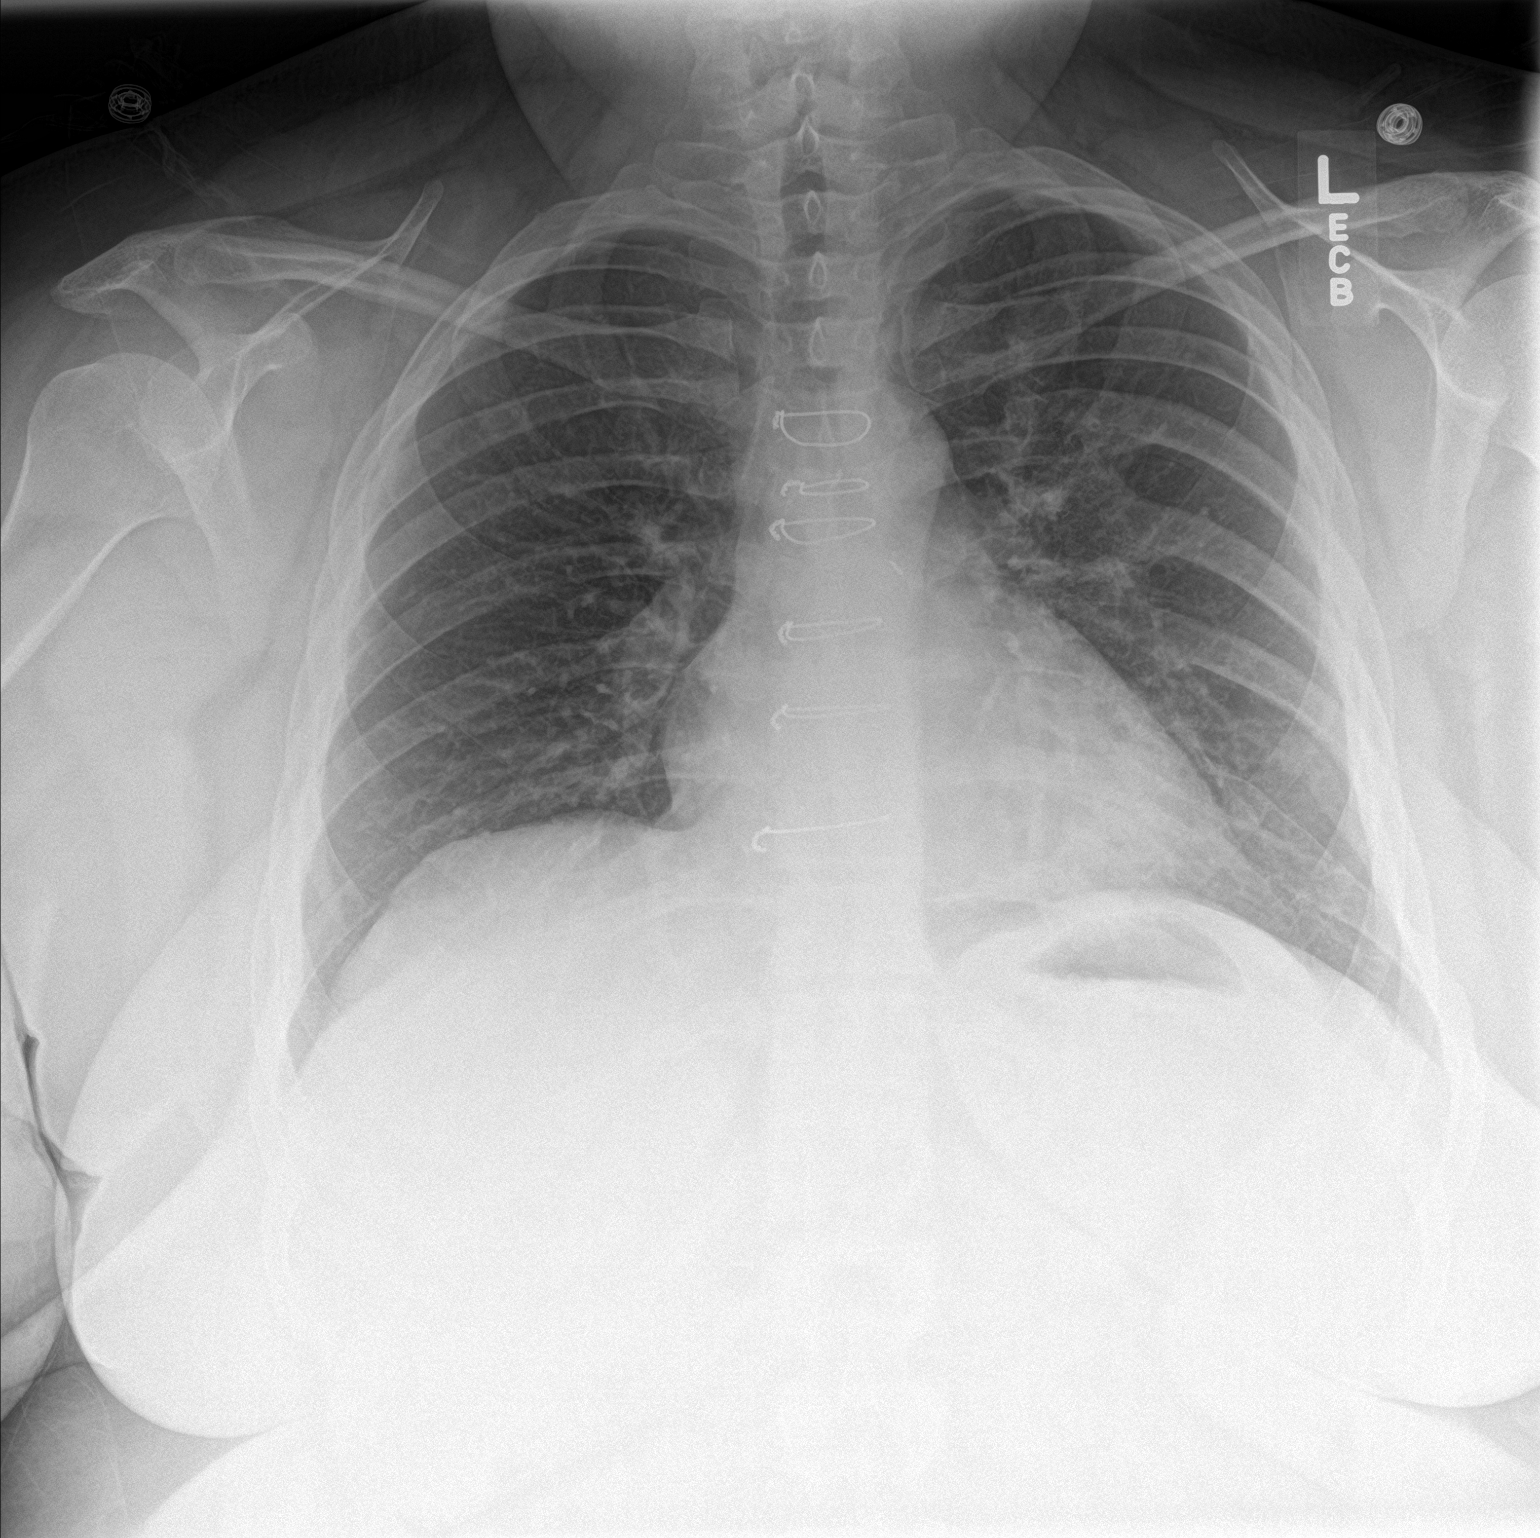

[chest lat]
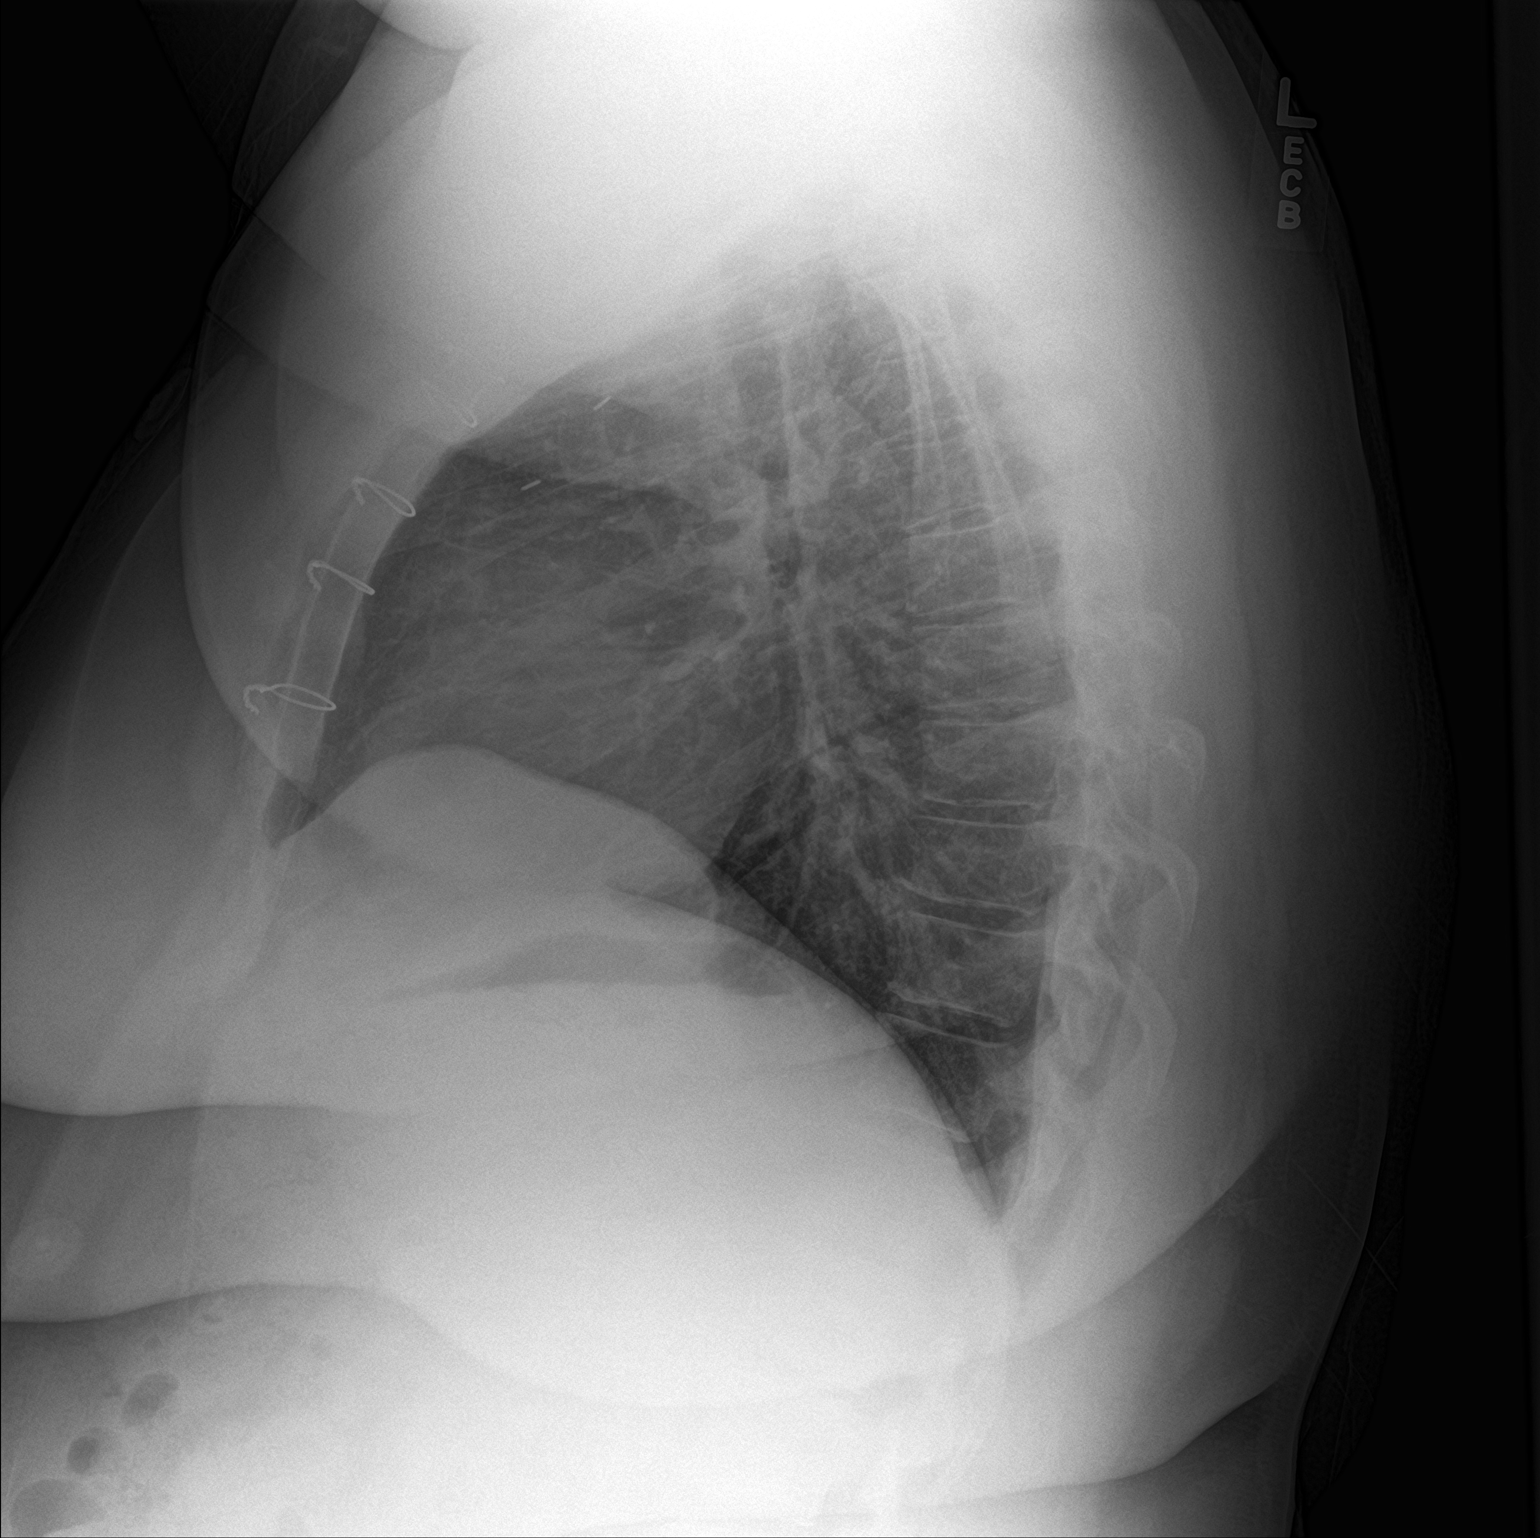

[2 of 2 positions shown; findings below may reference images not displayed]

FINDINGS: Median sternotomy with clips in the anterior mediastinum. Heart size
within normal limits. Negative for heart failure. Lungs are clear
without infiltrate effusion or mass.
IMPRESSION: No active cardiopulmonary disease.

## 2018-08-25 ENCOUNTER — Emergency Department (HOSPITAL_COMMUNITY)
Admission: EM | Admit: 2018-08-25 | Discharge: 2018-08-25 | Disposition: A | Discharge status: Home / Self Care | Payer: BLUE CROSS/BLUE SHIELD | Attending: Emergency Medicine | Admitting: Emergency Medicine

## 2018-08-25 ENCOUNTER — Emergency Department (HOSPITAL_COMMUNITY): Payer: BLUE CROSS/BLUE SHIELD

## 2018-08-25 ENCOUNTER — Encounter (HOSPITAL_COMMUNITY): Payer: Self-pay

## 2018-08-25 ENCOUNTER — Other Ambulatory Visit: Payer: Self-pay

## 2018-08-25 DIAGNOSIS — R05 Cough: Secondary | ICD-10-CM | POA: Diagnosis present

## 2018-08-25 DIAGNOSIS — I10 Essential (primary) hypertension: Secondary | ICD-10-CM | POA: Insufficient documentation

## 2018-08-25 DIAGNOSIS — Z79899 Other long term (current) drug therapy: Secondary | ICD-10-CM | POA: Diagnosis not present

## 2018-08-25 DIAGNOSIS — J069 Acute upper respiratory infection, unspecified: Secondary | ICD-10-CM

## 2018-08-25 HISTORY — DX: Essential (primary) hypertension: I10

## 2018-08-25 MED ORDER — GUAIFENESIN-DM 100-10 MG/5ML PO SYRP: 5.0000 mL | ORAL_SOLUTION | ORAL | 0 refills | Status: AC | PRN

## 2018-08-25 MED ORDER — PREDNISONE 20 MG PO TABS
40.0000 mg | ORAL_TABLET | Freq: Once | ORAL | Status: AC
  Administered 2018-08-25: 40 mg via ORAL
  Filled 2018-08-25: qty 2

## 2018-08-25 MED ORDER — PREDNISONE 20 MG PO TABS: 40.0000 mg | ORAL_TABLET | Freq: Every day | ORAL | 0 refills | Status: AC

## 2018-08-25 NOTE — ED Triage Notes (Addendum)
Pt reports that she began coughing on Sunday. 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 Sob started today. Uses inhaler that has helped with breathing for short intervals. Pt reports pain under breast increases with breathing

## 2018-08-25 NOTE — ED Provider Notes (Signed)
[facility] EMERGENCY DEPARTMENT Provider Note   CSN: [id number] Arrival date & time: 08/25/18  1721     History   Chief Complaint Chief Complaint  Patient presents with  . Cough  . Shortness of Breath    HPI Brenda Yang is a 32 y.o. female.  HPI Patient presents with cough.  Has been coughing around the last 4 days.  States she works at [employer] and several other people there have had similar symptoms.  Minimal sputum production.  Has an inhaler that she has helped a little bit.  Pain in her left lower anterior chest wall.  Worse with breathing.  No fever.  Has had a little diarrhea.  Low-grade fever per patient Past Medical History:  Diagnosis Date  . Hypertension     Patient Active Problem List   Diagnosis Date Noted  . Closed dislocation of patella 10/06/2013  . 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 Posterior tibial tendinitis 08/08/2011  . Pain in joint, ankle and foot 08/08/2011  . TENOSYNOVITIS OF FOOT AND ANKLE 02/05/2011  . FOOT PAIN, LEFT 02/05/2011    Past Surgical History:  Procedure Laterality Date  . TRACHEAL SURGERY       OB History   None      Home Medications    Prior to Admission medications   Medication Sig Start Date End Date Taking? Authorizing Provider  albuterol (PROVENTIL HFA;VENTOLIN HFA) 108 (90 BASE) MCG/ACT inhaler Inhale 2 puffs into the lungs every 6 (six) hours as needed for wheezing or shortness of breath. 04/11/15  Yes Rodolph Bong, MD  hydrALAZINE (APRESOLINE) 10 MG tablet Take 5 mg by mouth 2 (two) times daily. 07/02/18  Yes [provider]  labetalol (NORMODYNE) 200 MG tablet Take 200 mg by mouth 2 (two) times daily. 10/25/16  Yes [provider]  Phenyleph-Doxylamine-DM-APAP (TYLENOL COLD MULTI-SYMPTOM) 5-6.25-10-325 MG/15ML LIQD Take 30 mLs by mouth daily as needed (for cold symptoms).   Yes [provider]  sodium chloride (OCEAN) 0.65 % SOLN nasal spray Place 1 spray into both  nostrils as needed for congestion.   Yes [provider]  guaiFENesin-dextromethorphan (ROBITUSSIN DM) 100-10 MG/5ML syrup Take 5 mLs by mouth every 4 (four) hours as needed for cough. 08/25/18   Benjiman Core, MD  predniSONE (DELTASONE) 20 MG tablet Take 2 tablets (40 mg total) by mouth daily. 08/26/18   Benjiman Core, MD    Family History No family history on file.  Social History Social History   Tobacco Use  . Smoking status: Never Smoker  Substance Use Topics  . Alcohol use: Yes    Comment: occasionally  . Drug use: No     Allergies   Patient has no known allergies.   Review of Systems Review of Systems  Constitutional: Positive for appetite change.  HENT: Positive for congestion.   Respiratory: Positive for cough, shortness of breath and wheezing.   Cardiovascular: Positive for chest pain.  Gastrointestinal: Positive for abdominal pain.  Genitourinary: Negative for enuresis.  Musculoskeletal: Negative for back pain.  Skin: Negative for wound.  Neurological: Negative for weakness.  Psychiatric/Behavioral: Negative for confusion.     Physical Exam Updated Vital Signs BP (!) 181/91 (BP Location: Left Arm)   Pulse 75   Temp 98.8 F (37.1 C) (Oral)   Resp 18   Ht 5\' 6"  (1.676 m)   Wt (!) 149.7 kg   SpO2 97%   BMI 53.26 kg/m   Physical Exam  Constitutional: She appears  well-developed.  HENT:  Head: Normocephalic.  Neck: Neck supple.  Cardiovascular: Regular rhythm.  Pulmonary/Chest: Effort normal.  Abdominal: There is no tenderness.  Musculoskeletal:       Right lower leg: She exhibits no edema.       Left lower leg: She exhibits no edema.  Neurological: She is alert.  Skin: Skin is warm. Capillary refill takes less than 2 seconds.  Psychiatric: She has a normal mood and affect.     ED Treatments / Results  Labs (all labs ordered are listed, but only abnormal results are displayed) Labs Reviewed - No data to  display  EKG None  Radiology Dg Chest 2 View  Result Date: 08/25/2018 CLINICAL DATA:  Cough.  Shortness of breath. EXAM: CHEST - 2 VIEW COMPARISON:  March 12, 2017 FINDINGS: The heart size and mediastinal contours are within normal limits. Both lungs are clear. The visualized skeletal structures are unremarkable. IMPRESSION: No active cardiopulmonary disease. Electronically Signed   By: Gerome Sam III M.D   On: 08/25/2018 17:55    Procedures Procedures (including critical care time)  Medications Ordered in ED Medications  predniSONE (DELTASONE) tablet 40 mg (has no administration in time range)     Initial Impression / Assessment and Plan / ED Course  I have reviewed the triage vital signs and the nursing notes.  Pertinent labs & imaging results that were available during my care of the patient were reviewed by me and considered in my medical decision making (see chart for details).    Patient with URI symptoms.  Sick contacts at work with similar symptoms.  X-ray does not show pneumonia.  Reportedly has had a wheezes at home and gets more wheeze with infections.  Will give short dose of steroids due to this.  Has inhaler.  Well-appearing.  Discharge home.  Final Clinical Impressions(s) / ED Diagnoses   Final diagnoses:  Upper respiratory tract infection, unspecified type    ED Discharge Orders         Ordered    predniSONE (DELTASONE) 20 MG tablet  Daily     08/25/18 1910    guaiFENesin-dextromethorphan (ROBITUSSIN DM) 100-10 MG/5ML syrup  Every 4 hours PRN     08/25/18 1910           Benjiman Core, MD 08/25/18 1922

## 2018-08-25 NOTE — ED Notes (Signed)
Patient transported to X-ray 

## 2019-06-08 ENCOUNTER — Other Ambulatory Visit: Payer: Self-pay | Admitting: Physician Assistant

## 2019-06-08 DIAGNOSIS — N6320 Unspecified lump in the left breast, unspecified quadrant: Secondary | ICD-10-CM

## 2019-06-15 ENCOUNTER — Ambulatory Visit
Admission: RE | Admit: 2019-06-15 | Discharge: 2019-06-15 | Disposition: A | Discharge status: Home / Self Care | Payer: 59 | Source: Ambulatory Visit | Attending: Physician Assistant | Admitting: Physician Assistant

## 2019-06-15 ENCOUNTER — Ambulatory Visit
Admission: RE | Admit: 2019-06-15 | Discharge: 2019-06-15 | Disposition: A | Discharge status: Home / Self Care | Payer: BLUE CROSS/BLUE SHIELD | Source: Ambulatory Visit | Attending: Physician Assistant | Admitting: Physician Assistant

## 2019-06-15 ENCOUNTER — Other Ambulatory Visit: Payer: Self-pay

## 2019-06-15 DIAGNOSIS — N6320 Unspecified lump in the left breast, unspecified quadrant: Secondary | ICD-10-CM

## 2019-08-08 ENCOUNTER — Other Ambulatory Visit: Payer: Self-pay

## 2019-08-08 ENCOUNTER — Encounter (HOSPITAL_COMMUNITY): Payer: Self-pay | Admitting: Emergency Medicine

## 2019-08-08 ENCOUNTER — Emergency Department (HOSPITAL_COMMUNITY)
Admission: EM | Admit: 2019-08-08 | Discharge: 2019-08-08 | Disposition: A | Discharge status: Home / Self Care | Payer: 59 | Attending: Emergency Medicine | Admitting: Emergency Medicine

## 2019-08-08 DIAGNOSIS — K0889 Other specified disorders of teeth and supporting structures: Secondary | ICD-10-CM | POA: Insufficient documentation

## 2019-08-08 DIAGNOSIS — Z79899 Other long term (current) drug therapy: Secondary | ICD-10-CM | POA: Diagnosis not present

## 2019-08-08 DIAGNOSIS — I10 Essential (primary) hypertension: Secondary | ICD-10-CM | POA: Diagnosis not present

## 2019-08-08 DIAGNOSIS — K029 Dental caries, unspecified: Secondary | ICD-10-CM

## 2019-08-08 MED ORDER — LIDOCAINE HCL (PF) 1 % IJ SOLN
INTRAMUSCULAR | Status: AC
  Administered 2019-08-08: 09:00:00 2 mL
  Filled 2019-08-08: qty 2

## 2019-08-08 MED ORDER — CEPHALEXIN 500 MG PO CAPS: 500.0000 mg | ORAL_CAPSULE | Freq: Four times a day (QID) | ORAL | 0 refills | Status: DC

## 2019-08-08 MED ORDER — CEFTRIAXONE SODIUM 1 G IJ SOLR
1.0000 g | Freq: Once | INTRAMUSCULAR | Status: AC
  Administered 2019-08-08: 09:00:00 1 g via INTRAMUSCULAR
  Filled 2019-08-08: qty 10

## 2019-08-08 NOTE — Discharge Instructions (Addendum)
Follow-up with Dr. Hoyt Koch this week.  Call today to make an appointment

## 2019-08-08 NOTE — ED Triage Notes (Signed)
Pt reports on Thursday she began having pain in her bottom teeth. Went to emergency dentist on Saturday and they did xrays and said nothing was wrong and yesterday started having swelling in bottom teeth and chin.

## 2019-08-08 NOTE — ED Provider Notes (Signed)
[facility] EMERGENCY DEPARTMENT Provider Note   CSN: [id number] Arrival date & time: 08/08/19  [id number]     History   Chief Complaint Chief Complaint  Patient presents with  . Oral Swelling    HPI Brenda Yang is a 33 y.o. female.     Patient complains of swelling tenderness to her lower teeth and swelling to her face.  She saw her dentist a few days ago who did some x-rays and said her teeth looked okay.  Now she complains of fever  The history is provided by the patient. No language interpreter was used.  Dental Pain Location:  Lower Lower teeth location: Lower front teeth in the gums. Quality:  Constant and dull Onset quality:  Gradual Timing:  Constant Progression:  Worsening Chronicity:  New Context: not abscess   Relieved by:  Nothing Worsened by:  Nothing Ineffective treatments:  None tried Associated symptoms: no congestion and no headaches     Past Medical History:  Diagnosis Date  . Hypertension     Patient Active Problem List   Diagnosis Date Noted  . Closed dislocation of patella 10/06/2013  . 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 Posterior tibial tendinitis 08/08/2011  . Pain in joint, ankle and foot 08/08/2011  . TENOSYNOVITIS OF FOOT AND ANKLE 02/05/2011  . FOOT PAIN, LEFT 02/05/2011    Past Surgical History:  Procedure Laterality Date  . TRACHEAL SURGERY       OB History   No obstetric history on file.      Home Medications    Prior to Admission medications   Medication Sig Start Date End Date Taking? Authorizing Provider  albuterol (PROVENTIL HFA;VENTOLIN HFA) 108 (90 BASE) MCG/ACT inhaler Inhale 2 puffs into the lungs every 6 (six) hours as needed for wheezing or shortness of breath. 04/11/15   Rodolph Bongorey, Evan S, MD  cephALEXin (KEFLEX) 500 MG capsule Take 1 capsule (500 mg total) by mouth 4 (four) times daily. 08/08/19   Bethann BerkshireZammit, Jaeven Wanzer, MD  guaiFENesin-dextromethorphan (ROBITUSSIN DM) 100-10 MG/5ML syrup Take 5 mLs by mouth every 4  (four) hours as needed for cough. 08/25/18   Benjiman CorePickering, Nathan, MD  hydrALAZINE (APRESOLINE) 10 MG tablet Take 5 mg by mouth 2 (two) times daily. 07/02/18   [provider]  labetalol (NORMODYNE) 200 MG tablet Take 200 mg by mouth 2 (two) times daily. 10/25/16   [provider]  Phenyleph-Doxylamine-DM-APAP (TYLENOL COLD MULTI-SYMPTOM) 5-6.25-10-325 MG/15ML LIQD Take 30 mLs by mouth daily as needed (for cold symptoms).    [provider]  predniSONE (DELTASONE) 20 MG tablet Take 2 tablets (40 mg total) by mouth daily. 08/26/18   Benjiman CorePickering, Nathan, MD  sodium chloride (OCEAN) 0.65 % SOLN nasal spray Place 1 spray into both nostrils as needed for congestion.    [provider]    Family History Family History  Problem Relation Age of Onset  . Breast cancer Mother 6840       mid 5240's  . Breast cancer Paternal Aunt        over 6850    Social History Social History   Tobacco Use  . Smoking status: Never Smoker  Substance Use Topics  . Alcohol use: Yes    Comment: occasionally  . Drug use: No     Allergies   Patient has no known allergies.   Review of Systems Review of Systems  Constitutional: Negative for appetite change and fatigue.  HENT: Negative for congestion, ear discharge and sinus pressure.  Pain in teeth  Eyes: Negative for discharge.  Respiratory: Negative for cough.   Cardiovascular: Negative for chest pain.  Gastrointestinal: Negative for abdominal pain and diarrhea.  Genitourinary: Negative for frequency and hematuria.  Musculoskeletal: Negative for back pain.  Skin: Negative for rash.  Neurological: Negative for seizures and headaches.  Psychiatric/Behavioral: Negative for hallucinations.     Physical Exam Updated Vital Signs BP (!) 172/79   Pulse 98   Temp (!) 101.6 F (38.7 C) (Oral)   Resp 18   Ht 5\' 6"  (1.676 m)   Wt 122 kg   LMP 07/26/2019   SpO2 97%   BMI 43.41 kg/m   Physical Exam Vitals signs and  nursing note reviewed.  Constitutional:      Appearance: She is well-developed.  HENT:     Head: Normocephalic.     Comments: Tender lower teeth right gums at the front.    Mouth/Throat:     Comments: Submandibular gland swollen Eyes:     General: No scleral icterus.    Conjunctiva/sclera: Conjunctivae normal.  Neck:     Musculoskeletal: Neck supple.     Thyroid: No thyromegaly.  Cardiovascular:     Rate and Rhythm: Normal rate and regular rhythm.     Heart sounds: No murmur. No friction rub. No gallop.   Pulmonary:     Breath sounds: No stridor. No wheezing or rales.  Chest:     Chest wall: No tenderness.  Abdominal:     General: There is no distension.     Tenderness: There is no abdominal tenderness. There is no rebound.  Musculoskeletal: Normal range of motion.  Lymphadenopathy:     Cervical: No cervical adenopathy.  Skin:    Findings: No erythema or rash.  Neurological:     Mental Status: She is alert and oriented to person, place, and time.     Motor: No abnormal muscle tone.     Coordination: Coordination normal.  Psychiatric:        Behavior: Behavior normal.      ED Treatments / Results  Labs (all labs ordered are listed, but only abnormal results are displayed) Labs Reviewed - No data to display  EKG None  Radiology No results found.  Procedures Procedures (including critical care time)  Medications Ordered in ED Medications  cefTRIAXone (ROCEPHIN) injection 1 g (has no administration in time range)     Initial Impression / Assessment and Plan / ED Course  I have reviewed the triage vital signs and the nursing notes.  Pertinent labs & imaging results that were available during my care of the patient were reviewed by me and considered in my medical decision making (see chart for details).   Patient with probable cellulitis and mandibular gland infection to face.  She is given Rocephin and Keflex and will follow-up with oral surgeon       Final Clinical Impressions(s) / ED Diagnoses   Final diagnoses:  None    ED Discharge Orders         Ordered    cephALEXin (KEFLEX) 500 MG capsule  4 times daily     08/08/19 8676           Milton Ferguson, MD 08/08/19 [phone]

## 2020-08-09 ENCOUNTER — Emergency Department (HOSPITAL_COMMUNITY): Payer: BC Managed Care – PPO

## 2020-08-09 ENCOUNTER — Encounter (HOSPITAL_COMMUNITY): Payer: Self-pay

## 2020-08-09 ENCOUNTER — Emergency Department (HOSPITAL_COMMUNITY)
Admission: EM | Admit: 2020-08-09 | Discharge: 2020-08-09 | Disposition: A | Discharge status: Home / Self Care | Payer: BC Managed Care – PPO | Attending: Emergency Medicine | Admitting: Emergency Medicine

## 2020-08-09 ENCOUNTER — Other Ambulatory Visit: Payer: Self-pay

## 2020-08-09 DIAGNOSIS — M79605 Pain in left leg: Secondary | ICD-10-CM

## 2020-08-09 DIAGNOSIS — L03116 Cellulitis of left lower limb: Secondary | ICD-10-CM | POA: Insufficient documentation

## 2020-08-09 DIAGNOSIS — I10 Essential (primary) hypertension: Secondary | ICD-10-CM | POA: Diagnosis not present

## 2020-08-09 DIAGNOSIS — Z79899 Other long term (current) drug therapy: Secondary | ICD-10-CM | POA: Insufficient documentation

## 2020-08-09 DIAGNOSIS — M7989 Other specified soft tissue disorders: Secondary | ICD-10-CM

## 2020-08-09 MED ORDER — CEPHALEXIN 500 MG PO CAPS: 500.0000 mg | ORAL_CAPSULE | Freq: Four times a day (QID) | ORAL | 0 refills | Status: AC
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
## 2020-08-09 NOTE — Discharge Instructions (Addendum)
Your work-up today was overall reassuring.  There was no evidence of a blood clot in your legs.  The x-rays of both of your legs showed some soft tissue swelling which can be caused by multiple things.  They did comment that a screw might be loosening in your right ankle.  Please follow-up with Dr. Romeo Apple with orthopedic surgery whose contact information I have provided.  Please call his number to schedule an appointment.  Take Tylenol/ibuprofen for pain. Please take the prescribed antibiotic for possible skin infection.  Please take the medication as directed until finished even if your symptoms resolve.  This is important to prevent antibiotic resistance and reinfection.  Return to the ER for any new or worsening symptoms.  Please follow-up with your primary care doctor about the symptoms you are experiencing today.

## 2020-08-09 NOTE — ED Notes (Signed)
Area of light redness noted to back of mid calf area.  No known injury.

## 2020-08-09 NOTE — ED Triage Notes (Signed)
Pt has been having bilateral leg pain. Last night, she noticed left leg swelling and redness. Pt just got back from a 12 hour trip on Sunday.

## 2020-12-20 ENCOUNTER — Other Ambulatory Visit: Payer: Self-pay | Admitting: Nurse Practitioner

## 2020-12-20 ENCOUNTER — Emergency Department (HOSPITAL_COMMUNITY): Payer: BC Managed Care – PPO

## 2020-12-20 ENCOUNTER — Ambulatory Visit
Admission: RE | Admit: 2020-12-20 | Discharge: 2020-12-20 | Disposition: A | Discharge status: Home / Self Care | Payer: BC Managed Care – PPO | Source: Ambulatory Visit | Attending: Nurse Practitioner | Admitting: Nurse Practitioner

## 2020-12-20 ENCOUNTER — Encounter (HOSPITAL_COMMUNITY): Payer: Self-pay | Admitting: *Deleted

## 2020-12-20 ENCOUNTER — Other Ambulatory Visit: Payer: Self-pay

## 2020-12-20 ENCOUNTER — Emergency Department (HOSPITAL_COMMUNITY)
Admission: EM | Admit: 2020-12-20 | Discharge: 2020-12-20 | Disposition: A | Discharge status: Home / Self Care | Payer: BC Managed Care – PPO | Attending: Emergency Medicine | Admitting: Emergency Medicine

## 2020-12-20 DIAGNOSIS — M25512 Pain in left shoulder: Secondary | ICD-10-CM | POA: Insufficient documentation

## 2020-12-20 DIAGNOSIS — R079 Chest pain, unspecified: Secondary | ICD-10-CM

## 2020-12-20 DIAGNOSIS — R0600 Dyspnea, unspecified: Secondary | ICD-10-CM

## 2020-12-20 DIAGNOSIS — Z8616 Personal history of COVID-19: Secondary | ICD-10-CM

## 2020-12-20 DIAGNOSIS — I10 Essential (primary) hypertension: Secondary | ICD-10-CM | POA: Insufficient documentation

## 2020-12-20 DIAGNOSIS — Z79899 Other long term (current) drug therapy: Secondary | ICD-10-CM | POA: Insufficient documentation

## 2020-12-20 DIAGNOSIS — U071 COVID-19: Secondary | ICD-10-CM | POA: Insufficient documentation

## 2020-12-20 DIAGNOSIS — R0609 Other forms of dyspnea: Secondary | ICD-10-CM

## 2020-12-20 DIAGNOSIS — R0602 Shortness of breath: Secondary | ICD-10-CM

## 2020-12-20 LAB — CBC
HCT: 42 % (ref 36.0–46.0)
Hemoglobin: 13.4 g/dL (ref 12.0–15.0)
MCH: 27.6 pg (ref 26.0–34.0)
MCHC: 31.9 g/dL (ref 30.0–36.0)
MCV: 86.4 fL (ref 80.0–100.0)
Platelets: 125 10*3/uL — ABNORMAL LOW (ref 150–400)
RBC: 4.86 MIL/uL (ref 3.87–5.11)
RDW: 13.3 % (ref 11.5–15.5)
WBC: 4.1 10*3/uL (ref 4.0–10.5)
nRBC: 0 % (ref 0.0–0.2)

## 2020-12-20 LAB — BASIC METABOLIC PANEL
Anion gap: 6 (ref 5–15)
BUN: 6 mg/dL (ref 6–20)
CO2: 24 mmol/L (ref 22–32)
Calcium: 8.3 mg/dL — ABNORMAL LOW (ref 8.9–10.3)
Chloride: 105 mmol/L (ref 98–111)
Creatinine, Ser: 0.82 mg/dL (ref 0.44–1.00)
GFR, Estimated: >60 mL/min (ref >60)
Glucose, Bld: 139 mg/dL — ABNORMAL HIGH (ref 70–99)
Potassium: 3.6 mmol/L (ref 3.5–5.1)
Sodium: 135 mmol/L (ref 135–145)

## 2020-12-20 MED ORDER — NAPROXEN 500 MG PO TABS: 500.0000 mg | ORAL_TABLET | Freq: Two times a day (BID) | ORAL | 0 refills | Status: AC

## 2020-12-20 MED ORDER — IOHEXOL 350 MG/ML SOLN
100.0000 mL | Freq: Once | INTRAVENOUS | Status: AC | PRN
  Administered 2020-12-20: 100 mL via INTRAVENOUS

## 2020-12-20 NOTE — ED Provider Notes (Signed)
[facility] EMERGENCY DEPARTMENT Provider Note   CSN: [id number] Arrival date & time: 12/20/20  1728     History Chief Complaint  Patient presents with  . Chest Pain    Brenda Yang is a 35 y.o. female.  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  She had corrective surgery when she was decannulated and now no longer needs a tracheostomy.  She was in her usual state of health until couple weeks ago when she developed COVID-19, she had symptoms for approximately a week, then she tested positive, now she is much better but has noted that throughout the process she has had a sharp pain around her left chest and left shoulder.  This does seem to start in the shoulder moves its way down into the chest.  She is not particularly short of breath and has no fever, no chills, no swelling of the legs.  No recent travel trauma or immobilization, no recent surgery.  The patient was seen at her primary doctor's office where they obtained a D-dimer which was elevated, a chest x-ray which was unremarkable and she was sent to the emergency department for further evaluation of a possible blood clot.  Her symptoms are mild at this time, the pain seems to come and go  Past Medical History:  Diagnosis Date  . Hypertension     Patient Active Problem List   Diagnosis Date Noted  . Closed dislocation of patella 10/06/2013  . 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 Posterior tibial tendinitis 08/08/2011  . Pain in joint, ankle and foot 08/08/2011  . TENOSYNOVITIS OF FOOT AND ANKLE 02/05/2011  . FOOT PAIN, LEFT 02/05/2011    Past Surgical History:  Procedure Laterality Date  . TRACHEAL SURGERY       OB History   No obstetric history on file.     Family History  Problem Relation Age of Onset  . Breast cancer Mother 24       mid 52's  . Breast cancer Paternal Aunt        over 46     Social History   Tobacco Use  . Smoking status: Never Smoker  . Smokeless tobacco: Never Used  Substance Use Topics  . Alcohol use: Yes    Comment: occasionally  . Drug use: No    Home Medications Prior to Admission medications   Medication Sig Start Date End Date Taking? Authorizing Provider  naproxen (NAPROSYN) 500 MG tablet Take 1 tablet (500 mg total) by mouth 2 (two) times daily with a meal. 12/20/20  Yes Eber Hong, MD  albuterol (PROVENTIL HFA;VENTOLIN HFA) 108 (90 BASE) MCG/ACT inhaler Inhale 2 puffs into the lungs every 6 (six) hours as needed for wheezing or shortness of breath. 04/11/15   Rodolph Bong, MD  guaiFENesin-dextromethorphan (ROBITUSSIN DM) 100-10 MG/5ML syrup Take 5 mLs by mouth every 4 (four) hours as needed for cough. 08/25/18   Benjiman Core, MD  hydrALAZINE (APRESOLINE) 10 MG tablet Take 5 mg by mouth 2 (two) times daily. 07/02/18   [provider]  labetalol (NORMODYNE) 200 MG tablet Take 200 mg by mouth 2 (two) times daily. 10/25/16   [provider]  Phenyleph-Doxylamine-DM-APAP (TYLENOL COLD MULTI-SYMPTOM) 5-6.25-10-325 MG/15ML LIQD Take 30 mLs by mouth daily as needed (for cold symptoms).    [provider]  predniSONE (DELTASONE) 20 MG tablet Take 2  tablets (40 mg total) by mouth daily. 08/26/18   Benjiman Core, MD  sodium chloride (OCEAN) 0.65 % SOLN nasal spray Place 1 spray into both nostrils as needed for congestion.    [provider]    Allergies    Patient has no known allergies.  Review of Systems   Review of Systems  All other systems reviewed and are negative.   Physical Exam Updated Vital Signs BP (!) 146/78   Pulse 90   Temp 98.9 F (37.2 C) (Oral)   Resp 15   Ht 1.676 m (5\' 6" )   Wt (!) 140.6 kg   SpO2 100%   BMI 50.04 kg/m   Physical Exam Vitals and nursing note reviewed.  Constitutional:      General: She is not in acute distress.    Appearance: She is well-developed and  well-nourished.  HENT:     Head: Normocephalic and atraumatic.     Mouth/Throat:     Mouth: Oropharynx is clear and moist.     Pharynx: No oropharyngeal exudate.  Eyes:     General: No scleral icterus.       Right eye: No discharge.        Left eye: No discharge.     Extraocular Movements: EOM normal.     Conjunctiva/sclera: Conjunctivae normal.     Pupils: Pupils are equal, round, and reactive to light.  Neck:     Thyroid: No thyromegaly.     Vascular: No JVD.  Cardiovascular:     Rate and Rhythm: Normal rate and regular rhythm.     Pulses: Intact distal pulses.     Heart sounds: Normal heart sounds. No murmur heard. No friction rub. No gallop.   Pulmonary:     Effort: Pulmonary effort is normal. No respiratory distress.     Breath sounds: Normal breath sounds. No wheezing or rales.  Abdominal:     General: Bowel sounds are normal. There is no distension.     Palpations: Abdomen is soft. There is no mass.     Tenderness: There is no abdominal tenderness.  Musculoskeletal:        General: No tenderness or edema. Normal range of motion.     Cervical back: Normal range of motion and neck supple.  Lymphadenopathy:     Cervical: No cervical adenopathy.  Skin:    General: Skin is warm and dry.     Findings: No erythema or rash.  Neurological:     Mental Status: She is alert.     Coordination: Coordination normal.  Psychiatric:        Mood and Affect: Mood and affect normal.        Behavior: Behavior normal.     ED Results / Procedures / Treatments   Labs (all labs ordered are listed, but only abnormal results are displayed) Labs Reviewed  BASIC METABOLIC PANEL - Abnormal; Notable for the following components:      Result Value   Glucose, Bld 139 (*)    Calcium 8.3 (*)    All other components within normal limits  CBC - Abnormal; Notable for the following components:   Platelets 125 (*)    All other components within normal limits  POC URINE PREG, ED    EKG EKG  Interpretation  Date/Time:  Thursday December 20 2020 18:42:21 EST Ventricular Rate:  113 PR Interval:  124 QRS Duration: 74 QT Interval:  316 QTC Calculation: 433 R Axis:   -6 Text Interpretation: Sinus  tachycardia Possible Left atrial enlargement Minimal voltage criteria for LVH, may be normal variant ( R in aVL ) Borderline ECG Since last tracing LVH criteria now present, borderline, rate faster Confirmed by Eber Hong ([id number]) on 12/20/2020 7:53:38 PM   Radiology DG Chest 2 View  Result Date: 12/20/2020 CLINICAL DATA:  Chest pain EXAM: CHEST - 2 VIEW COMPARISON:  08/25/2018 FINDINGS: Post sternotomy changes. No focal opacity or pleural effusion. Cardiomediastinal silhouette within normal limits. No pneumothorax. Chronic left-sided rib deformities. IMPRESSION: No active cardiopulmonary disease. Electronically Signed   By: Jasmine Pang M.D.   On: 12/20/2020 17:39   CT Angio Chest PE W and/or Wo Contrast  Result Date: 12/20/2020 CLINICAL DATA:  Chest pain short of breath EXAM: CT ANGIOGRAPHY CHEST WITH CONTRAST TECHNIQUE: Multidetector CT imaging of the chest was performed using the standard protocol during bolus administration of intravenous contrast. Multiplanar CT image reconstructions and MIPs were obtained to evaluate the vascular anatomy. CONTRAST:  OMNIPAQUE IOHEXOL 350 MG/ML SOLN COMPARISON:  Chest x-ray 12/20/2020 FINDINGS: Cardiovascular: Satisfactory opacification of the pulmonary arteries to the segmental level. No evidence of pulmonary embolism. Normal heart size. No pericardial effusion. Nonaneurysmal aorta. No dissection is seen. Mediastinum/Nodes: No enlarged mediastinal, hilar, or axillary lymph nodes. Thyroid gland, trachea, and esophagus demonstrate no significant findings. Lungs/Pleura: Patchy foci of consolidations within the bilateral lungs consistent with pneumonia. No pleural effusion or pneumothorax Upper Abdomen: Gallstones.  No acute abnormality Musculoskeletal:  Multiple chronic left posterior rib fractures/deformities. Post sternotomy changes. Review of the MIP images confirms the above findings. IMPRESSION: 1. Negative for acute pulmonary embolus or aortic dissection. 2. Patchy foci of consolidations within the bilateral lungs consistent with multifocal pneumonia. 3. Gallstones. Electronically Signed   By: Jasmine Pang M.D.   On: 12/20/2020 22:58    Procedures Procedures   Medications Ordered in ED Medications  iohexol (OMNIPAQUE) 350 MG/ML injection 100 mL (100 mLs Intravenous Contrast Given 12/20/20 2239)    ED Course  I have reviewed the triage vital signs and the nursing notes.  Pertinent labs & imaging results that were available during my care of the patient were reviewed by me and considered in my medical decision making (see chart for details).    MDM Rules/Calculators/A&P                          Ultimately this patient is well-appearing, she has no focal symptoms to suggest pulmonary embolism other than a heart rate that ranges around 100.  That being said she has recently had Covid, I have looked at her chest x-ray that was done earlier in the day and is totally normal without any signs of abnormalities.  I have ordered a CT scan to further evaluate for possible blood clot.  The patient is in total agreement with the plan  CT neg for PE Shows residual mild covid Pt has normal VS Stable for d/c Pt agreeable  Final Clinical Impression(s) / ED Diagnoses Final diagnoses:  COVID-19  Chest pain, unspecified type    Rx / DC Orders ED Discharge Orders         Ordered    naproxen (NAPROSYN) 500 MG tablet  2 times daily with meals        12/20/20 2309           Eber Hong, MD 12/20/20 2310

## 2020-12-20 NOTE — Discharge Instructions (Signed)
Your CT scan showed covid - but no blood clots.  Please take Naprosyn, 500mg  by mouth twice daily as needed for pain - this in an antiinflammatory medicine (NSAID) and is similar to ibuprofen - many people feel that it is stronger than ibuprofen and it is easier to take since it is a smaller pill.  Please use this only for 1 week - if your pain persists, you will need to follow up with your doctor in the office for ongoing guidance and pain control.

## 2020-12-20 NOTE — ED Triage Notes (Signed)
States she was advised by her PCP to come in for evaluation of chest pain and possible CT OF CHEST

## 2021-01-16 DIAGNOSIS — R079 Chest pain, unspecified: Secondary | ICD-10-CM | POA: Insufficient documentation

## 2021-02-07 DIAGNOSIS — R079 Chest pain, unspecified: Secondary | ICD-10-CM | POA: Diagnosis not present

## 2021-02-07 DIAGNOSIS — I10 Essential (primary) hypertension: Secondary | ICD-10-CM | POA: Diagnosis not present

## 2021-02-13 DIAGNOSIS — R0600 Dyspnea, unspecified: Secondary | ICD-10-CM | POA: Diagnosis not present

## 2021-02-13 DIAGNOSIS — G4710 Hypersomnia, unspecified: Secondary | ICD-10-CM | POA: Diagnosis not present

## 2021-02-13 DIAGNOSIS — R0683 Snoring: Secondary | ICD-10-CM | POA: Diagnosis not present

## 2021-02-15 DIAGNOSIS — I10 Essential (primary) hypertension: Secondary | ICD-10-CM | POA: Diagnosis not present

## 2021-02-15 DIAGNOSIS — R079 Chest pain, unspecified: Secondary | ICD-10-CM | POA: Diagnosis not present

## 2021-02-19 DIAGNOSIS — R0600 Dyspnea, unspecified: Secondary | ICD-10-CM | POA: Diagnosis not present

## 2021-02-19 DIAGNOSIS — R079 Chest pain, unspecified: Secondary | ICD-10-CM | POA: Diagnosis not present

## 2021-02-19 DIAGNOSIS — I10 Essential (primary) hypertension: Secondary | ICD-10-CM | POA: Diagnosis not present

## 2021-02-19 DIAGNOSIS — R002 Palpitations: Secondary | ICD-10-CM | POA: Diagnosis not present

## 2021-03-13 DIAGNOSIS — G4733 Obstructive sleep apnea (adult) (pediatric): Secondary | ICD-10-CM | POA: Diagnosis not present

## 2021-03-19 DIAGNOSIS — G4733 Obstructive sleep apnea (adult) (pediatric): Secondary | ICD-10-CM | POA: Diagnosis not present

## 2021-03-19 DIAGNOSIS — R0683 Snoring: Secondary | ICD-10-CM | POA: Diagnosis not present

## 2021-03-19 DIAGNOSIS — G4710 Hypersomnia, unspecified: Secondary | ICD-10-CM | POA: Diagnosis not present

## 2021-05-02 DIAGNOSIS — G4733 Obstructive sleep apnea (adult) (pediatric): Secondary | ICD-10-CM | POA: Diagnosis not present

## 2021-06-01 DIAGNOSIS — G4733 Obstructive sleep apnea (adult) (pediatric): Secondary | ICD-10-CM | POA: Diagnosis not present

## 2021-07-02 DIAGNOSIS — J3089 Other allergic rhinitis: Secondary | ICD-10-CM | POA: Diagnosis not present

## 2021-07-02 DIAGNOSIS — G4733 Obstructive sleep apnea (adult) (pediatric): Secondary | ICD-10-CM | POA: Diagnosis not present

## 2021-07-02 DIAGNOSIS — G4710 Hypersomnia, unspecified: Secondary | ICD-10-CM | POA: Diagnosis not present

## 2021-07-02 DIAGNOSIS — R0683 Snoring: Secondary | ICD-10-CM | POA: Diagnosis not present

## 2021-07-02 DIAGNOSIS — I10 Essential (primary) hypertension: Secondary | ICD-10-CM | POA: Diagnosis not present

## 2021-07-10 DIAGNOSIS — J069 Acute upper respiratory infection, unspecified: Secondary | ICD-10-CM | POA: Diagnosis not present

## 2021-08-02 DIAGNOSIS — G4733 Obstructive sleep apnea (adult) (pediatric): Secondary | ICD-10-CM | POA: Diagnosis not present

## 2021-09-01 DIAGNOSIS — G4733 Obstructive sleep apnea (adult) (pediatric): Secondary | ICD-10-CM | POA: Diagnosis not present

## 2021-10-02 DIAGNOSIS — G4733 Obstructive sleep apnea (adult) (pediatric): Secondary | ICD-10-CM | POA: Diagnosis not present

## 2021-10-09 DIAGNOSIS — I10 Essential (primary) hypertension: Secondary | ICD-10-CM | POA: Diagnosis not present

## 2021-10-09 DIAGNOSIS — R600 Localized edema: Secondary | ICD-10-CM | POA: Diagnosis not present

## 2021-10-09 DIAGNOSIS — R635 Abnormal weight gain: Secondary | ICD-10-CM | POA: Diagnosis not present

## 2021-10-09 DIAGNOSIS — R079 Chest pain, unspecified: Secondary | ICD-10-CM | POA: Diagnosis not present

## 2021-10-09 DIAGNOSIS — R7982 Elevated C-reactive protein (CRP): Secondary | ICD-10-CM | POA: Diagnosis not present

## 2021-10-25 DIAGNOSIS — H40013 Open angle with borderline findings, low risk, bilateral: Secondary | ICD-10-CM | POA: Diagnosis not present

## 2021-11-01 DIAGNOSIS — G4733 Obstructive sleep apnea (adult) (pediatric): Secondary | ICD-10-CM | POA: Diagnosis not present

## 2021-12-02 DIAGNOSIS — G4733 Obstructive sleep apnea (adult) (pediatric): Secondary | ICD-10-CM | POA: Diagnosis not present

## 2021-12-04 DIAGNOSIS — R002 Palpitations: Secondary | ICD-10-CM | POA: Diagnosis not present

## 2021-12-04 DIAGNOSIS — R0789 Other chest pain: Secondary | ICD-10-CM | POA: Diagnosis not present

## 2021-12-04 DIAGNOSIS — R609 Edema, unspecified: Secondary | ICD-10-CM | POA: Insufficient documentation

## 2021-12-04 DIAGNOSIS — I10 Essential (primary) hypertension: Secondary | ICD-10-CM | POA: Diagnosis not present

## 2022-01-01 DIAGNOSIS — I10 Essential (primary) hypertension: Secondary | ICD-10-CM | POA: Diagnosis not present

## 2022-01-01 DIAGNOSIS — Z0000 Encounter for general adult medical examination without abnormal findings: Secondary | ICD-10-CM | POA: Diagnosis not present

## 2022-01-01 DIAGNOSIS — E282 Polycystic ovarian syndrome: Secondary | ICD-10-CM | POA: Diagnosis not present

## 2022-01-01 DIAGNOSIS — Z130 Encounter for screening for diseases of the blood and blood-forming organs and certain disorders involving the immune mechanism: Secondary | ICD-10-CM | POA: Diagnosis not present

## 2022-01-02 ENCOUNTER — Other Ambulatory Visit: Payer: Self-pay | Admitting: Physician Assistant

## 2022-01-02 DIAGNOSIS — G4733 Obstructive sleep apnea (adult) (pediatric): Secondary | ICD-10-CM | POA: Diagnosis not present

## 2022-01-02 DIAGNOSIS — N644 Mastodynia: Secondary | ICD-10-CM

## 2022-01-02 DIAGNOSIS — Z803 Family history of malignant neoplasm of breast: Secondary | ICD-10-CM

## 2022-01-15 DIAGNOSIS — Z6841 Body Mass Index (BMI) 40.0 and over, adult: Secondary | ICD-10-CM | POA: Diagnosis not present

## 2022-01-15 DIAGNOSIS — R7301 Impaired fasting glucose: Secondary | ICD-10-CM | POA: Insufficient documentation

## 2022-01-20 DIAGNOSIS — N644 Mastodynia: Secondary | ICD-10-CM | POA: Diagnosis not present

## 2022-01-20 DIAGNOSIS — N6321 Unspecified lump in the left breast, upper outer quadrant: Secondary | ICD-10-CM | POA: Diagnosis not present

## 2022-01-20 DIAGNOSIS — R921 Mammographic calcification found on diagnostic imaging of breast: Secondary | ICD-10-CM | POA: Diagnosis not present

## 2022-01-20 DIAGNOSIS — R922 Inconclusive mammogram: Secondary | ICD-10-CM | POA: Diagnosis not present

## 2022-01-20 DIAGNOSIS — Z803 Family history of malignant neoplasm of breast: Secondary | ICD-10-CM | POA: Diagnosis not present

## 2022-01-28 ENCOUNTER — Other Ambulatory Visit: Payer: BC Managed Care – PPO

## 2022-04-16 DIAGNOSIS — H40013 Open angle with borderline findings, low risk, bilateral: Secondary | ICD-10-CM | POA: Diagnosis not present

## 2022-06-16 DIAGNOSIS — G4733 Obstructive sleep apnea (adult) (pediatric): Secondary | ICD-10-CM | POA: Diagnosis not present

## 2022-07-16 NOTE — Progress Notes (Unsigned)
Office Visit Note  Patient: Brenda Yang             Date of Birth: February 04, 1986           MRN: [id number]             PCP: [facility] Referring: Nicholes Rough, PA-C Visit Date: 07/17/2022 Occupation: _0 @  Subjective:  No chief complaint on file.   History of Present Illness: Brenda Yang is a 36 y.o. female here for evaluation of joint pains, swelling and elevated CRP.  She had negative ANA and ESR and normal renal function. She has a history of PCOS. ***   Activities of Daily Living:  Patient reports morning stiffness for *** {minute/hour:19697}.   Patient {ACTIONS;DENIES/REPORTS:21021675::"Denies"} nocturnal pain.  Difficulty dressing/grooming: {ACTIONS;DENIES/REPORTS:21021675::"Denies"} Difficulty climbing stairs: {ACTIONS;DENIES/REPORTS:21021675::"Denies"} Difficulty getting out of chair: {ACTIONS;DENIES/REPORTS:21021675::"Denies"} Difficulty using hands for taps, buttons, cutlery, and/or writing: {ACTIONS;DENIES/REPORTS:21021675::"Denies"}  No Rheumatology ROS completed.   PMFS History:  Patient Active Problem List   Diagnosis Date Noted   Closed dislocation of patella 10/06/2013   Patellar subluxation 09/06/2013   Posterior tibial tendinitis 08/08/2011   Pain in joint, ankle and foot 08/08/2011   TENOSYNOVITIS OF FOOT AND ANKLE 02/05/2011   FOOT PAIN, LEFT 02/05/2011    Past Medical History:  Diagnosis Date   Hypertension     Family History  Problem Relation Age of Onset   Breast cancer Mother 32       mid 23's   Breast cancer Paternal Aunt        over 75   Past Surgical History:  Procedure Laterality Date   TRACHEAL SURGERY     Social History   Social History Narrative   Not on file    There is no immunization history on file for this patient.   Objective: Vital Signs: There were no vitals taken for this visit.   Physical Exam   Musculoskeletal Exam: ***  CDAI Exam: CDAI Score: -- Patient Global:  --; Provider Global: -- Swollen: --; Tender: -- Joint Exam 07/17/2022   No joint exam has been documented for this visit   There is currently no information documented on the homunculus. Go to the Rheumatology activity and complete the homunculus joint exam.  Investigation: No additional findings.  Imaging: No results found.  Recent Labs: Lab Results  Component Value Date   WBC 4.1 12/20/2020   HGB 13.4 12/20/2020   PLT 125 (L) 12/20/2020   NA 135 12/20/2020   K 3.6 12/20/2020   CL 105 12/20/2020   CO2 24 12/20/2020   GLUCOSE 139 (H) 12/20/2020   BUN 6 12/20/2020   CREATININE 0.82 12/20/2020   CALCIUM 8.3 (L) 12/20/2020   GFRAA >60 03/12/2017    Speciality Comments: No specialty comments available.  Procedures:  No procedures performed Allergies: Patient has no known allergies.   Assessment / Plan:     Visit Diagnoses: No diagnosis found.  Orders: No orders of the defined types were placed in this encounter.  No orders of the defined types were placed in this encounter.   Face-to-face time spent with patient was *** minutes. Greater than 50% of time was spent in counseling and coordination of care.  Follow-Up Instructions: No follow-ups on file.   Collier Salina, MD  Note - This record has been created using [employer].  Chart creation errors have been sought, but may not always  have been located. Such creation errors do not reflect on  the standard of medical care.

## 2022-07-17 ENCOUNTER — Ambulatory Visit (INDEPENDENT_AMBULATORY_CARE_PROVIDER_SITE_OTHER): Payer: BC Managed Care – PPO

## 2022-07-17 ENCOUNTER — Ambulatory Visit: Payer: BC Managed Care – PPO | Attending: Internal Medicine | Admitting: Internal Medicine

## 2022-07-17 ENCOUNTER — Encounter: Payer: Self-pay | Admitting: Internal Medicine

## 2022-07-17 VITALS — BP 160/115 | HR 78 | Resp 18 | Ht 66.0 in | Wt 341.0 lb

## 2022-07-17 DIAGNOSIS — G8929 Other chronic pain: Secondary | ICD-10-CM

## 2022-07-17 DIAGNOSIS — M25571 Pain in right ankle and joints of right foot: Secondary | ICD-10-CM

## 2022-07-17 DIAGNOSIS — L719 Rosacea, unspecified: Secondary | ICD-10-CM

## 2022-07-17 DIAGNOSIS — M25572 Pain in left ankle and joints of left foot: Secondary | ICD-10-CM | POA: Diagnosis not present

## 2022-07-17 DIAGNOSIS — G4733 Obstructive sleep apnea (adult) (pediatric): Secondary | ICD-10-CM | POA: Diagnosis not present

## 2022-07-17 DIAGNOSIS — M25552 Pain in left hip: Secondary | ICD-10-CM

## 2022-07-17 DIAGNOSIS — R7982 Elevated C-reactive protein (CRP): Secondary | ICD-10-CM | POA: Diagnosis not present

## 2022-07-17 DIAGNOSIS — N979 Female infertility, unspecified: Secondary | ICD-10-CM | POA: Insufficient documentation

## 2022-07-17 DIAGNOSIS — M25551 Pain in right hip: Secondary | ICD-10-CM

## 2022-07-19 LAB — C3 AND C4
C3 Complement: 163 mg/dL (ref 83–193)
C4 Complement: 31 mg/dL (ref 15–57)

## 2022-07-19 LAB — ANTI-DNA ANTIBODY, DOUBLE-STRANDED: ds DNA Ab: <1 IU/mL

## 2022-07-19 LAB — C-REACTIVE PROTEIN: CRP: 13.3 mg/L — ABNORMAL HIGH (ref <8.0)

## 2022-07-22 ENCOUNTER — Encounter: Payer: Self-pay | Admitting: Internal Medicine

## 2022-07-25 MED ORDER — CYCLOBENZAPRINE HCL 10 MG PO TABS: 10.0000 mg | ORAL_TABLET | Freq: Three times a day (TID) | ORAL | 1 refills | Status: DC | PRN

## 2022-07-25 NOTE — Addendum Note (Signed)
Addended by: Fuller Plan on: 07/25/2022 05:25 PM   Modules accepted: Orders

## 2022-08-05 DIAGNOSIS — M25511 Pain in right shoulder: Secondary | ICD-10-CM | POA: Diagnosis not present

## 2022-08-27 DIAGNOSIS — M79671 Pain in right foot: Secondary | ICD-10-CM | POA: Diagnosis not present

## 2022-09-11 DIAGNOSIS — M25571 Pain in right ankle and joints of right foot: Secondary | ICD-10-CM | POA: Diagnosis not present

## 2022-09-29 DIAGNOSIS — M25571 Pain in right ankle and joints of right foot: Secondary | ICD-10-CM | POA: Diagnosis not present

## 2022-10-02 DIAGNOSIS — M25571 Pain in right ankle and joints of right foot: Secondary | ICD-10-CM | POA: Diagnosis not present

## 2022-10-10 DIAGNOSIS — I10 Essential (primary) hypertension: Secondary | ICD-10-CM | POA: Diagnosis not present

## 2022-10-10 DIAGNOSIS — R000 Tachycardia, unspecified: Secondary | ICD-10-CM | POA: Diagnosis not present

## 2022-10-10 DIAGNOSIS — I959 Hypotension, unspecified: Secondary | ICD-10-CM | POA: Diagnosis not present

## 2022-10-10 DIAGNOSIS — G4489 Other headache syndrome: Secondary | ICD-10-CM | POA: Diagnosis not present

## 2022-12-10 ENCOUNTER — Other Ambulatory Visit: Payer: Self-pay

## 2022-12-10 ENCOUNTER — Emergency Department (HOSPITAL_COMMUNITY)
Admission: EM | Admit: 2022-12-10 | Discharge: 2022-12-10 | Disposition: A | Discharge status: Home / Self Care | Payer: BC Managed Care – PPO | Attending: Emergency Medicine | Admitting: Emergency Medicine

## 2022-12-10 DIAGNOSIS — M545 Low back pain, unspecified: Secondary | ICD-10-CM | POA: Diagnosis not present

## 2022-12-10 DIAGNOSIS — S39012A Strain of muscle, fascia and tendon of lower back, initial encounter: Secondary | ICD-10-CM | POA: Diagnosis not present

## 2022-12-10 DIAGNOSIS — I10 Essential (primary) hypertension: Secondary | ICD-10-CM | POA: Diagnosis not present

## 2022-12-10 DIAGNOSIS — Z79899 Other long term (current) drug therapy: Secondary | ICD-10-CM | POA: Insufficient documentation

## 2022-12-10 DIAGNOSIS — F17290 Nicotine dependence, other tobacco product, uncomplicated: Secondary | ICD-10-CM | POA: Insufficient documentation

## 2022-12-10 DIAGNOSIS — M25551 Pain in right hip: Secondary | ICD-10-CM | POA: Insufficient documentation

## 2022-12-10 DIAGNOSIS — W1839XA Other fall on same level, initial encounter: Secondary | ICD-10-CM | POA: Insufficient documentation

## 2022-12-10 MED ORDER — ACETAMINOPHEN 500 MG PO TABS
1000.0000 mg | ORAL_TABLET | Freq: Once | ORAL | Status: AC
  Administered 2022-12-10: 1000 mg via ORAL
  Filled 2022-12-10: qty 2

## 2022-12-10 MED ORDER — METHOCARBAMOL 500 MG PO TABS: 500.0000 mg | ORAL_TABLET | Freq: Two times a day (BID) | ORAL | 0 refills | Status: AC | PRN

## 2022-12-10 MED ORDER — KETOROLAC TROMETHAMINE 15 MG/ML IJ SOLN
15.0000 mg | Freq: Once | INTRAMUSCULAR | Status: AC
  Administered 2022-12-10: 15 mg via INTRAMUSCULAR
  Filled 2022-12-10: qty 1

## 2022-12-10 NOTE — ED Provider Notes (Signed)
[facility] EMERGENCY DEPARTMENT Provider Note  CSN: [id number] Arrival date & time: 12/10/22 1194  Chief Complaint(s) Back Pain  HPI Brenda Yang is a 37 y.o. female presenting to the emergency department for low back pain.  She reports that this morning she bent over to get her dog leash and then developed acute onset of low back pain.  She did not take anything for pain and was able to drive herself to the emergency department.  She denies any bowel or bladder incontinence, numbness, tingling, weakness.  Reports the pain is worse with movement.  No history of IV drug use.  No fevers or chills.  She reports that she fell on her right hip a few days ago and initially had some right hip pain although this is resolved.  No head injury.  No back pain at that time.  No urinary symptoms or flank pain.   Past Medical History Past Medical History:  Diagnosis Date   Hypertension    Patient Active Problem List   Diagnosis Date Noted   Morbid obesity with BMI of 40.0-44.9, adult ([place]) 07/17/2022   Female infertility, primary 07/17/2022   Bilateral ankle pain 07/17/2022   CRP elevated 07/17/2022   Rosacea, unspecified 07/17/2022   IFG (impaired fasting glucose) 01/15/2022   Edema 12/04/2021   Chest pain 01/16/2021   PCOS (polycystic ovarian syndrome) 05/14/2016   Essential hypertension 04/25/2016   Closed dislocation of patella 10/06/2013   Patellar subluxation 09/06/2013   Posterior tibial tendinitis 08/08/2011   Pain in joint, ankle and foot 08/08/2011   TENOSYNOVITIS OF FOOT AND ANKLE 02/05/2011   FOOT PAIN, LEFT 02/05/2011   Home Medication(s) Prior to Admission medications   Medication Sig Start Date End Date Taking? Authorizing Provider  methocarbamol (ROBAXIN) 500 MG tablet Take 1 tablet (500 mg total) by mouth 2 (two) times daily as needed for muscle spasms. 12/10/22  Yes Cristie Hem, MD  albuterol (PROVENTIL HFA;VENTOLIN HFA) 108 (90 BASE) MCG/ACT inhaler Inhale 2  puffs into the lungs every 6 (six) hours as needed for wheezing or shortness of breath. 04/11/15   Gregor Hams, MD  fexofenadine (ALLEGRA) 180 MG tablet Take 180 mg by mouth daily.    [provider]  furosemide (LASIX) 20 MG tablet Take by mouth as needed. 01/01/22 01/01/23  [provider]  guaiFENesin-dextromethorphan (ROBITUSSIN DM) 100-10 MG/5ML syrup Take 5 mLs by mouth every 4 (four) hours as needed for cough. Patient not taking: Reported on 07/17/2022 08/25/18   Davonna Belling, MD  hydrALAZINE (APRESOLINE) 10 MG tablet Take 5 mg by mouth 2 (two) times daily. Patient not taking: Reported on 07/17/2022 07/02/18   [provider]  labetalol (NORMODYNE) 100 MG tablet Take 100 mg by mouth 2 (two) times daily. 05/21/22   [provider]  labetalol (NORMODYNE) 200 MG tablet Take 200 mg by mouth 2 (two) times daily. Patient not taking: Reported on 07/17/2022 10/25/16   [provider]  Magnesium 250 MG TABS Take 250 mg by mouth daily.    [provider]  naproxen (NAPROSYN) 500 MG tablet Take 1 tablet (500 mg total) by mouth 2 (two) times daily with a meal. Patient not taking: Reported on 07/17/2022 12/20/20   Noemi Chapel, MD  Phenyleph-Doxylamine-DM-APAP (TYLENOL COLD MULTI-SYMPTOM) 5-6.25-10-325 MG/15ML LIQD Take 30 mLs by mouth daily as needed (for cold symptoms). Patient not taking: Reported on 07/17/2022    [provider]  POTASSIUM GLUCONATE PO Take by mouth daily.  [provider]  predniSONE (DELTASONE) 20 MG tablet Take 2 tablets (40 mg total) by mouth daily. Patient not taking: Reported on 07/17/2022 08/26/18   Benjiman Core, MD  sodium chloride (OCEAN) 0.65 % SOLN nasal spray Place 1 spray into both nostrils as needed for congestion. Patient not taking: Reported on 07/17/2022    [provider]  TURMERIC PO Take by mouth daily.    [provider]                                                                                                                                     Past Surgical History Past Surgical History:  Procedure Laterality Date   ANKLE SURGERY Right    FOREARM SURGERY Right    MANDIBLE FRACTURE SURGERY     TRACHEAL SURGERY     x4   Family History Family History  Problem Relation Age of Onset   Breast cancer Mother 29       mid 10's   Diabetes Mother    Hypertension Mother    Healthy Brother    Breast cancer Paternal Aunt        over 72    Social History Social History   Tobacco Use   Smoking status: Some Days    Types: Cigars    Passive exposure: Never   Smokeless tobacco: Never  Vaping Use   Vaping Use: Never used  Substance Use Topics   Alcohol use: Yes    Comment: occasionally   Drug use: No   Allergies Hydrochlorothiazide  Review of Systems Review of Systems  All other systems reviewed and are negative.   Physical Exam Vital Signs  I have reviewed the triage vital signs BP (!) 181/99   Pulse 63   Temp 98.5 F (36.9 C) (Oral)   Resp 16   Ht 5\' 6"  (1.676 m)   Wt (!) 154.2 kg   SpO2 97%   BMI 54.88 kg/m  Physical Exam Vitals and nursing note reviewed.  Constitutional:      Appearance: Normal appearance. She is obese.  HENT:     Head: Normocephalic and atraumatic.     Mouth/Throat:     Mouth: Mucous membranes are moist.  Eyes:     Conjunctiva/sclera: Conjunctivae normal.  Cardiovascular:     Rate and Rhythm: Normal rate.  Pulmonary:     Effort: Pulmonary effort is normal. No respiratory distress.  Abdominal:     General: Abdomen is flat.     Tenderness: There is no right CVA tenderness or left CVA tenderness.  Musculoskeletal:        General: No deformity.     Comments: No midline T or L-spine tenderness  Skin:    General: Skin is warm and dry.     Capillary Refill: Capillary refill takes less than 2 seconds.  Neurological:     General: No focal deficit present.  Mental Status: She is alert. Mental status is at  baseline.     Comments: Strength 5 out of 5 in the bilateral lower extremities with hip flexion, knee extension, foot dorsi/plantarflexion.  2+ patellar reflexes bilaterally.  No sensory deficit to light touch.  Psychiatric:        Mood and Affect: Mood normal.        Behavior: Behavior normal.     ED Results and Treatments Labs (all labs ordered are listed, but only abnormal results are displayed) Labs Reviewed - No data to display                                                                                                                        Radiology No results found.  Pertinent labs & imaging results that were available during my care of the patient were reviewed by me and considered in my medical decision making (see MDM for details).  Medications Ordered in ED Medications  ketorolac (TORADOL) 15 MG/ML injection 15 mg (has no administration in time range)  acetaminophen (TYLENOL) tablet 1,000 mg (has no administration in time range)                                                                                                                                     Procedures Procedures  (including critical care time)  Medical Decision Making / ED Course   MDM:  37 year old female presenting to the emergency department with low back pain.  Suspect lumbar sprain in the setting of bending over.  Physical examination including neurologic exam is reassuring without focal abnormality.  Patient able to ambulate with steady gait.  Patient has no red flags by history or physical exam to suggest occult spinal cord compression, cauda equina syndrome, occult infectious process.  She has no urinary symptoms to suggest urinary cause of her symptoms and no CVA tenderness.  Discussed self-care for low back pain including pain control.  Patient requested muscle relaxer, discussed that may not help significantly, advised to discontinue if it is not beneficial.  Discussed avoidance of  alcohol, heavy machinery use or driving when taking this medication since it may make her somnolent.  Will discharge patient to home. All questions answered. Patient comfortable with plan of discharge. Return precautions discussed with patient and specified on the after visit summary.         Medicines ordered and prescription  drug management: Meds ordered this encounter  Medications   ketorolac (TORADOL) 15 MG/ML injection 15 mg   acetaminophen (TYLENOL) tablet 1,000 mg   methocarbamol (ROBAXIN) 500 MG tablet    Sig: Take 1 tablet (500 mg total) by mouth 2 (two) times daily as needed for muscle spasms.    Dispense:  20 tablet    Refill:  0    -I have reviewed the patients home medicines and have made adjustments as needed   Social Determinants of Health:  Diagnosis or treatment significantly limited by social determinants of health: obesity   Reevaluation: After the interventions noted above, I reevaluated the patient and found that they have improved  Co morbidities that complicate the patient evaluation  Past Medical History:  Diagnosis Date   Hypertension       Dispostion: Disposition decision including need for hospitalization was considered, and patient discharged from emergency department.    Final Clinical Impression(s) / ED Diagnoses Final diagnoses:  Strain of lumbar region, initial encounter     This chart was dictated using voice recognition software.  Despite best efforts to proofread,  errors can occur which can change the documentation meaning.    Cristie Hem, MD 12/10/22 [phone]

## 2022-12-10 NOTE — ED Triage Notes (Signed)
Pt fell on her right hip on Monday and felt fine but this morning pt bent over to get dog leash and back pain started. Pt able to drive herself here with lower mid back pain.

## 2022-12-10 NOTE — Discharge Instructions (Addendum)
We evaluated you for your back pain.  Your exam was reassuring.  Your symptoms are most likely caused by a muscle strain or ligament sprain.  Please take Tylenol and Motrin for your symptoms at home.  You can take 650 mg of Tylenol every 6 hours and 600 mg of ibuprofen every 6 hours as needed for your symptoms.  You can take these medicines together as needed, either at the same time, or alternating every 3 hours.  I have also prescribed you a muscle relaxant which she can take twice a day as needed for spasm.  This medicine does not always help so if you do not think it is improving her symptoms, do not take it.  Do not drive a vehicle or operate heavy machinery while taking this medication.  Do not combine this medication with alcohol.  Please continue to be as active as possible.  Lying in bed can make this pain persist and get worse.  I have attached some gentle back exercises which she can try at home for your symptoms.  Please follow-up with your primary doctor in 1 week.  Please return to the emergency department if you develop any new or worsening symptoms such as numbness or tingling, inability to walk, incontinence of stool or urine, fevers or chills, or any other concerning symptoms.

## 2023-01-15 DIAGNOSIS — M5451 Vertebrogenic low back pain: Secondary | ICD-10-CM | POA: Diagnosis not present
